# Patient Record
Sex: Female | Born: 1985 | Race: Black or African American | Hispanic: No | Marital: Married | Smoking: Never smoker
Health system: Southern US, Community
[De-identification: ages and names within clinical notes are randomized; demographics above are authoritative.]

## PROBLEM LIST (undated history)

## (undated) ENCOUNTER — Inpatient Hospital Stay (HOSPITAL_COMMUNITY): Payer: Self-pay

## (undated) DIAGNOSIS — N390 Urinary tract infection, site not specified: Secondary | ICD-10-CM

## (undated) DIAGNOSIS — M542 Cervicalgia: Secondary | ICD-10-CM

## (undated) DIAGNOSIS — D649 Anemia, unspecified: Secondary | ICD-10-CM

---

## 2006-04-01 ENCOUNTER — Emergency Department (HOSPITAL_COMMUNITY): Admission: EM | Admit: 2006-04-01 | Discharge: 2006-04-01 | Payer: Self-pay | Admitting: Family Medicine

## 2006-11-07 ENCOUNTER — Emergency Department (HOSPITAL_COMMUNITY): Admission: EM | Admit: 2006-11-07 | Discharge: 2006-11-07 | Payer: Self-pay | Admitting: Emergency Medicine

## 2007-03-23 ENCOUNTER — Emergency Department (HOSPITAL_COMMUNITY): Admission: EM | Admit: 2007-03-23 | Discharge: 2007-03-23 | Payer: Self-pay | Admitting: Family Medicine

## 2008-02-01 ENCOUNTER — Ambulatory Visit (HOSPITAL_COMMUNITY): Admission: RE | Admit: 2008-02-01 | Discharge: 2008-02-01 | Payer: Self-pay | Admitting: Obstetrics

## 2008-02-26 ENCOUNTER — Inpatient Hospital Stay (HOSPITAL_COMMUNITY): Admission: AD | Admit: 2008-02-26 | Discharge: 2008-02-26 | Payer: Self-pay | Admitting: Obstetrics

## 2008-03-18 ENCOUNTER — Inpatient Hospital Stay (HOSPITAL_COMMUNITY): Admission: AD | Admit: 2008-03-18 | Discharge: 2008-03-18 | Payer: Self-pay | Admitting: Obstetrics

## 2008-06-13 ENCOUNTER — Inpatient Hospital Stay (HOSPITAL_COMMUNITY): Admission: AD | Admit: 2008-06-13 | Discharge: 2008-06-13 | Payer: Self-pay | Admitting: Obstetrics & Gynecology

## 2008-06-21 ENCOUNTER — Inpatient Hospital Stay (HOSPITAL_COMMUNITY): Admission: AD | Admit: 2008-06-21 | Discharge: 2008-06-21 | Payer: Self-pay | Admitting: Obstetrics

## 2008-06-22 ENCOUNTER — Inpatient Hospital Stay (HOSPITAL_COMMUNITY): Admission: AD | Admit: 2008-06-22 | Discharge: 2008-06-26 | Payer: Self-pay | Admitting: Psychiatry

## 2008-06-23 ENCOUNTER — Encounter: Payer: Self-pay | Admitting: Obstetrics

## 2010-01-28 ENCOUNTER — Emergency Department (HOSPITAL_COMMUNITY): Admission: EM | Admit: 2010-01-28 | Discharge: 2010-01-28 | Payer: Self-pay | Admitting: Emergency Medicine

## 2010-07-24 ENCOUNTER — Inpatient Hospital Stay: Admission: AD | Admit: 2010-07-24 | Discharge: 2010-07-24 | Payer: Self-pay | Admitting: Obstetrics

## 2010-07-24 ENCOUNTER — Emergency Department (HOSPITAL_COMMUNITY): Admission: EM | Admit: 2010-07-24 | Discharge: 2010-07-24 | Payer: Self-pay | Admitting: Emergency Medicine

## 2010-12-02 ENCOUNTER — Encounter: Payer: Self-pay | Admitting: Obstetrics and Gynecology

## 2011-01-24 LAB — URINALYSIS, ROUTINE W REFLEX MICROSCOPIC
Bilirubin Urine: NEGATIVE
Glucose, UA: NEGATIVE mg/dL
Ketones, ur: NEGATIVE mg/dL
Protein, ur: NEGATIVE mg/dL
pH: 7 (ref 5.0–8.0)

## 2011-01-24 LAB — COMPREHENSIVE METABOLIC PANEL
AST: 13 U/L (ref 0–37)
Albumin: 3.4 g/dL — ABNORMAL LOW (ref 3.5–5.2)
Alkaline Phosphatase: 37 U/L — ABNORMAL LOW (ref 39–117)
BUN: 7 mg/dL (ref 6–23)
Creatinine, Ser: 0.77 mg/dL (ref 0.4–1.2)
GFR calc Af Amer: >60 mL/min (ref >60)
Potassium: 3.4 mEq/L — ABNORMAL LOW (ref 3.5–5.1)
Total Protein: 6.7 g/dL (ref 6.0–8.3)

## 2011-01-24 LAB — CBC
MCV: 89.7 fL (ref 78.0–100.0)
Platelets: 232 10*3/uL (ref 150–400)
RBC: 3.6 MIL/uL — ABNORMAL LOW (ref 3.87–5.11)
RDW: 12.6 % (ref 11.5–15.5)
WBC: 4.1 10*3/uL (ref 4.0–10.5)

## 2011-01-24 LAB — URINE MICROSCOPIC-ADD ON

## 2011-01-24 LAB — DIFFERENTIAL
Eosinophils Relative: 4 % (ref 0–5)
Lymphocytes Relative: 42 % (ref 12–46)
Monocytes Absolute: 0.3 10*3/uL (ref 0.1–1.0)
Monocytes Relative: 6 % (ref 3–12)
Neutro Abs: 1.9 10*3/uL (ref 1.7–7.7)

## 2011-02-04 LAB — URINE MICROSCOPIC-ADD ON

## 2011-02-04 LAB — DIFFERENTIAL
Basophils Relative: 1 % (ref 0–1)
Eosinophils Absolute: 0.1 10*3/uL (ref 0.0–0.7)
Lymphocytes Relative: 34 % (ref 12–46)
Neutro Abs: 2.4 10*3/uL (ref 1.7–7.7)

## 2011-02-04 LAB — URINALYSIS, ROUTINE W REFLEX MICROSCOPIC
Glucose, UA: NEGATIVE mg/dL
Hgb urine dipstick: NEGATIVE
Specific Gravity, Urine: 1.024 (ref 1.005–1.030)

## 2011-02-04 LAB — COMPREHENSIVE METABOLIC PANEL
Albumin: 4 g/dL (ref 3.5–5.2)
Alkaline Phosphatase: 41 U/L (ref 39–117)
BUN: 9 mg/dL (ref 6–23)
GFR calc Af Amer: >60 mL/min (ref >60)
Potassium: 4.3 mEq/L (ref 3.5–5.1)
Sodium: 140 mEq/L (ref 135–145)
Total Protein: 7.6 g/dL (ref 6.0–8.3)

## 2011-02-04 LAB — URINE CULTURE: Culture: NO GROWTH

## 2011-02-04 LAB — POCT PREGNANCY, URINE: Preg Test, Ur: NEGATIVE

## 2011-02-04 LAB — CBC
Platelets: 245 10*3/uL (ref 150–400)
RDW: 14.1 % (ref 11.5–15.5)

## 2011-03-26 ENCOUNTER — Emergency Department (HOSPITAL_BASED_OUTPATIENT_CLINIC_OR_DEPARTMENT_OTHER)
Admission: EM | Admit: 2011-03-26 | Discharge: 2011-03-26 | Disposition: A | Discharge status: Home / Self Care | Payer: 59 | Attending: Emergency Medicine | Admitting: Emergency Medicine

## 2011-03-26 ENCOUNTER — Emergency Department (INDEPENDENT_AMBULATORY_CARE_PROVIDER_SITE_OTHER): Payer: 59

## 2011-03-26 DIAGNOSIS — IMO0002 Reserved for concepts with insufficient information to code with codable children: Secondary | ICD-10-CM

## 2011-03-26 DIAGNOSIS — R109 Unspecified abdominal pain: Secondary | ICD-10-CM | POA: Insufficient documentation

## 2011-03-26 DIAGNOSIS — K219 Gastro-esophageal reflux disease without esophagitis: Secondary | ICD-10-CM | POA: Insufficient documentation

## 2011-03-26 LAB — URINALYSIS, ROUTINE W REFLEX MICROSCOPIC
Bilirubin Urine: NEGATIVE
Ketones, ur: 15 mg/dL — AB
Nitrite: NEGATIVE
Specific Gravity, Urine: 1.036 — ABNORMAL HIGH (ref 1.005–1.030)
Urobilinogen, UA: 1 mg/dL (ref 0.0–1.0)

## 2011-03-26 LAB — WET PREP, GENITAL
Clue Cells Wet Prep HPF POC: NONE SEEN
Trich, Wet Prep: NONE SEEN
Yeast Wet Prep HPF POC: NONE SEEN

## 2011-03-26 LAB — PREGNANCY, URINE: Preg Test, Ur: POSITIVE

## 2011-03-26 NOTE — Op Note (Signed)
NAMENATHALIE, Donna Conley             ACCOUNT NO.:  [id number]   MEDICAL RECORD NO.:  [id number]          PATIENT TYPE:  INP   LOCATION:  9199                          FACILITY:  [facility]   PHYSICIAN:  Charles A. Clearance Coots, M.D.DATE OF BIRTH:  14-Mar-1986   DATE OF PROCEDURE:  06/23/2008  DATE OF DISCHARGE:                               OPERATIVE REPORT   PREOPERATIVE DIAGNOSIS:  Arrest of descent.   POSTOPERATIVE DIAGNOSIS:  Arrest of descent.   PROCEDURE:  Primary low-transverse cesarean section.   SURGEON:  Charles A. Clearance Coots, MD   ASSISTANTSelmer Dominion, surgical technician.   ANESTHESIA:  General.   ESTIMATED BLOOD LOSS:  1200 mL.   IV FLUIDS:  3000 mL.   URINE OUTPUT:  125 mL clear.   COMPLICATIONS:  None.   Foley to gravity.   FINDINGS:  Viable female at 15.  Apgars of 8 at 1 minute, 9 at 5  minutes.  Weight of 7 pounds.  Cord pH of 7.37.   OPERATION:  The patient was brought to the operating room after  satisfactory general endotracheal anesthesia.  Because of inadequate  epidural level, the abdomen was prepped and draped in the usual sterile  fashion.  Pfannenstiel skin incision was made with the scalpel that was  deepened down to the fascia with a scalpel.  The fascia was nicked in  the midline and the fascial incision was extended to left and to the  right with curved Mayo scissors.  The superior and inferior fascial  edges were taken off of the rectus muscles both blunt and sharp  dissection.  The rectus muscle was bluntly and sharply divided in the  midline and the peritoneum was entered digitally and was digitally  extended to left and to the right.  The bladder blade was positioned and  the vesicouterine fold of peritoneum above the reflection of the urinary  bladder was grasped with forceps and incised and undermined with  Metzenbaum scissors.  The incision was extended to left and to the right  with Metzenbaum scissors.  The bladder flap was then developed and  the  bladder blade was repositioned in front of the urinary bladder placing  it well out of the operative field.  The uterus was then entered  transversely in the lower uterine segment with the scalpel.  Clear  amniotic fluid was expelled.  The uterine incision was extended to left  and to the right with the bandage scissors.  The vertex was noted to be  left occiput posterior.  The occiput was rotated anteriorly into the  incision and the delivery was accomplished with the aid of fundal  pressure from the assistant.  The infant's mouth and nose were suctioned  with a suction bulb and the delivery was completed with the aid of  fundal pressure from the assistant.  Cord blood and cord pH was obtained  and the placenta was spontaneously expelled from the uterine cavity  intact.  The endometrial surface was thoroughly debrided with a dry lap  sponge.  The edges of the uterine incision were grasped with ring  forceps.  The uterus was closed with a continuous interlocking suture of  0 Monocryl from each corner to the center.  A posterior extension was  also closed with continuous interlocking suture of 0 Monocryl in 2  layers.  Hemostasis was excellent.  Pelvic cavity was thoroughly  irrigated with warm saline solution and all clots were removed.  The  abdomen was then closed as follows.  The parietal peritoneum was closed  with continuous suture of 2-0 Monocryl.  Fascia was closed with a  continuous suture of 0 Vicryl.  Subcutaneous tissue was thoroughly  irrigated with warm saline solution and all areas of subcutaneous  bleeding were coagulated with the Bovie.  Skin was then closed with  surgical stainless steel staples.  Sterile bandage was applied to the  incision closure.  Surgical technician indicated that all needle,  sponge, and instrument counts were correct x2.  The patient tolerated  the procedure well, transported to the recovery room in satisfactory  condition.      Charles  A. Clearance Coots, M.D.  Electronically Signed     CAH/MEDQ  D:  06/23/2008  T:  06/23/2008  Job:  [id number]

## 2011-03-29 NOTE — Discharge Summary (Signed)
NAMESETSUKO, ROBINS             ACCOUNT NO.:  [id number]   MEDICAL RECORD NO.:  [id number]          PATIENT TYPE:  INP   LOCATION:  9111                          FACILITY:  [facility]   PHYSICIAN:  Roseanna Rainbow, M.D.DATE OF BIRTH:  11-Aug-1986   DATE OF ADMISSION:  06/22/2008  DATE OF DISCHARGE:  06/26/2008                               DISCHARGE SUMMARY   CHIEF COMPLAINT:  The patient is a 25 year old gravida with an estimated  date of confinement of June 29, 2008, who presents with uterine  contractions.   ANTEPARTUM COURSE:  Prenatal labs: GBS positive.   PAST SURGICAL HISTORY:  She denies.   PAST MEDICAL HISTORY:  She denies.   MEDICATIONS:  Prenatal vitamins.   ALLERGIES:  No known drug allergies.   SOCIAL HISTORY:  She is single.  She denies any tobacco, ethanol, or  drug use.   FAMILY HISTORY:  Hypertension.   PHYSICAL EXAMINATION:  VITAL SIGNS:  Stable.  GENERAL:  Afebrile.  LUNGS:  Clear to auscultation bilaterally.  HEART:  Regular rate and rhythm.  ABDOMEN:  Gravid, nontender, cervix 3 cm dilated, 70% effaced with a  vertex at a -3 station.   ASSESSMENT AND PLAN:  Intrauterine pregnancy at 39 weeks, latent phase  of labor.  Admit, expectant management.   HOSPITAL COURSE:  The patient was admitted.  She progressed in labor to  9 cm of dilatation.  The position of the fetal head was felt to be left  occiput posterior and this persisted.  There was no further dilatation  or descend for greater than 3 hours and at this point the decision was  made to proceed with a cesarean delivery.  Please see the dictated  operative summary for further details.  On postoperative day #1, her  hemoglobin was 7.9.  She was hemodynamically stable.  The remainder of  her hospital course was uneventful.  She was discharged to home on  postoperative day #3 tolerating a regular diet.   DISCHARGE DIAGNOSES:  1. Intrauterine pregnancy at term.  2. Arrest of dilatation.  3.  Persistent occiput posterior.   PROCEDURE:  Cesarean delivery.   CONDITION:  Stable.   DIET:  Regular.   ACTIVITY:  Pelvic rest and progressive activity.   MEDICATIONS:  Percocet, ibuprofen, and NuvaRing.   DISPOSITION:  The patient was to follow up in the office in 2 weeks.      Roseanna Rainbow, M.D.  Electronically Signed     LAJ/MEDQ  D:  07/17/2008  T:  07/18/2008  Job:  [id number]

## 2011-05-12 ENCOUNTER — Emergency Department (HOSPITAL_BASED_OUTPATIENT_CLINIC_OR_DEPARTMENT_OTHER)
Admission: EM | Admit: 2011-05-12 | Discharge: 2011-05-12 | Disposition: A | Discharge status: Home / Self Care | Payer: Self-pay | Attending: Emergency Medicine | Admitting: Emergency Medicine

## 2011-05-12 DIAGNOSIS — L293 Anogenital pruritus, unspecified: Secondary | ICD-10-CM | POA: Insufficient documentation

## 2011-05-12 DIAGNOSIS — N760 Acute vaginitis: Secondary | ICD-10-CM | POA: Insufficient documentation

## 2011-05-12 DIAGNOSIS — K219 Gastro-esophageal reflux disease without esophagitis: Secondary | ICD-10-CM | POA: Insufficient documentation

## 2011-05-12 LAB — URINE MICROSCOPIC-ADD ON

## 2011-05-12 LAB — URINALYSIS, ROUTINE W REFLEX MICROSCOPIC
Bilirubin Urine: NEGATIVE
Glucose, UA: NEGATIVE mg/dL
Ketones, ur: NEGATIVE mg/dL
pH: 7 (ref 5.0–8.0)

## 2011-05-12 LAB — WET PREP, GENITAL

## 2011-05-13 LAB — GC/CHLAMYDIA PROBE AMP, GENITAL
Chlamydia, DNA Probe: NEGATIVE
GC Probe Amp, Genital: NEGATIVE

## 2011-05-16 ENCOUNTER — Emergency Department (HOSPITAL_BASED_OUTPATIENT_CLINIC_OR_DEPARTMENT_OTHER)
Admission: EM | Admit: 2011-05-16 | Discharge: 2011-05-17 | Disposition: A | Discharge status: Home / Self Care | Payer: Self-pay | Attending: Emergency Medicine | Admitting: Emergency Medicine

## 2011-05-16 DIAGNOSIS — L989 Disorder of the skin and subcutaneous tissue, unspecified: Secondary | ICD-10-CM | POA: Insufficient documentation

## 2011-08-09 LAB — CBC
HCT: 23.8 — ABNORMAL LOW
HCT: 37.2
Hemoglobin: 7.9 — CL
MCHC: 32.7
MCHC: 33.3
MCV: 90.8
Platelets: 172
Platelets: 264
RDW: 15.8 — ABNORMAL HIGH
WBC: 9.5

## 2011-08-09 LAB — URINALYSIS, ROUTINE W REFLEX MICROSCOPIC
Ketones, ur: NEGATIVE
Protein, ur: NEGATIVE
Urobilinogen, UA: 0.2

## 2011-08-09 LAB — URINE MICROSCOPIC-ADD ON

## 2011-08-09 LAB — RPR: RPR Ser Ql: NONREACTIVE

## 2011-08-22 ENCOUNTER — Inpatient Hospital Stay (INDEPENDENT_AMBULATORY_CARE_PROVIDER_SITE_OTHER)
Admission: RE | Admit: 2011-08-22 | Discharge: 2011-08-22 | Disposition: A | Discharge status: Home / Self Care | Payer: Self-pay | Source: Ambulatory Visit | Attending: Emergency Medicine | Admitting: Emergency Medicine

## 2011-08-22 DIAGNOSIS — L988 Other specified disorders of the skin and subcutaneous tissue: Secondary | ICD-10-CM

## 2011-08-23 ENCOUNTER — Emergency Department (HOSPITAL_BASED_OUTPATIENT_CLINIC_OR_DEPARTMENT_OTHER)
Admission: EM | Admit: 2011-08-23 | Discharge: 2011-08-23 | Disposition: A | Discharge status: Home / Self Care | Payer: 59 | Attending: Emergency Medicine | Admitting: Emergency Medicine

## 2011-08-23 DIAGNOSIS — R21 Rash and other nonspecific skin eruption: Secondary | ICD-10-CM

## 2011-08-23 MED ORDER — PREDNISONE 20 MG PO TABS: 40.0000 mg | ORAL_TABLET | Freq: Every day | ORAL | Status: AC

## 2011-08-23 MED ORDER — PREDNISONE 50 MG PO TABS
60.0000 mg | ORAL_TABLET | Freq: Once | ORAL | Status: AC
  Administered 2011-08-23: 60 mg via ORAL
  Filled 2011-08-23: qty 1

## 2011-08-23 NOTE — ED Notes (Signed)
Raised rash noted on bilateral hands and feet that started 2 days ago unrelieved after using steroid cream.

## 2011-08-23 NOTE — ED Provider Notes (Addendum)
History   Donna Conley with rash on b/l hands and feet. Gradual onset about 2-3d ago. First noticed on palms of hands and has since spread to dorsal aspect of hands and also feet. Rash does not itch, burn or hurt. No drainage.  No new exposures that pt is aware of. No sore throat. No fever or chills. No swelling. No joint pain. No hx of similar rash. No n/v. No contacts with similar symptoms. Has tried steroid cream without change.  CSN: [id number] Arrival date & time: 08/23/2011  9:55 AM  Chief Complaint  Patient presents with  . Rash    (Consider location/radiation/quality/duration/timing/severity/associated sxs/prior treatment) HPI  History reviewed. No pertinent past medical history.  Past Surgical History  Procedure Date  . Cesarean section     No family history on file.  History  Substance Use Topics  . Smoking status: Never Smoker   . Smokeless tobacco: Never Used  . Alcohol Use: No    OB History    Grav Para Term Preterm Abortions TAB SAB Ect Mult Living                  Review of Systems   Review of symptoms negative unless otherwise noted in HPI.   Allergies  Review of patient's allergies indicates no known allergies.  Home Medications  No current outpatient prescriptions on file.  BP 117/71  Temp(Src) 98.3 F (36.8 C) (Oral)  Resp 16  Ht 5\' 3"  (1.6 m)  Wt 179 lb (81.194 kg)  BMI 31.71 kg/m2  SpO2 100%  LMP 08/07/2011  Physical Exam  Nursing note and vitals reviewed. Constitutional: She appears well-developed and well-nourished. No distress.  HENT:  Head: Normocephalic and atraumatic.  Mouth/Throat: Oropharynx is clear and moist.  Eyes: Conjunctivae are normal. Right eye exhibits no discharge. Left eye exhibits no discharge.  Neck: Neck supple.  Cardiovascular: Normal rate, regular rhythm and normal heart sounds.  Exam reveals no gallop and no friction rub.   No murmur heard. Pulmonary/Chest: Effort normal and breath sounds normal. No respiratory  distress.  Abdominal: Soft. She exhibits no distension. There is no tenderness.  Musculoskeletal: She exhibits no edema and no tenderness.  Neurological: She is alert.  Skin: Skin is warm and dry.       Flat, erythematous blanching rash b/l hands and feet. Irregular macules. Nontender. No induration. No drainage.   Psychiatric: She has a normal mood and affect. Her behavior is normal. Thought content normal.    ED Course  Procedures (including critical care time)  Labs Reviewed - No data to display No results found.   No diagnosis found.    MDM  Donna Conley with rash to b/l hands and feet. Nonspecific. No new exposures. Afebrile and nontoxic. Plan short course steroids for possible inflammatory response. Outpt fu.       Raeford Razor, MD 08/27/11 1009  Raeford Razor, MD 08/29/11 [phone]

## 2011-09-11 ENCOUNTER — Other Ambulatory Visit: Payer: Self-pay

## 2011-09-11 ENCOUNTER — Emergency Department (HOSPITAL_BASED_OUTPATIENT_CLINIC_OR_DEPARTMENT_OTHER)
Admission: EM | Admit: 2011-09-11 | Discharge: 2011-09-11 | Disposition: A | Discharge status: Home / Self Care | Payer: Self-pay | Attending: Emergency Medicine | Admitting: Emergency Medicine

## 2011-09-11 ENCOUNTER — Emergency Department (INDEPENDENT_AMBULATORY_CARE_PROVIDER_SITE_OTHER): Payer: Self-pay

## 2011-09-11 ENCOUNTER — Encounter (HOSPITAL_BASED_OUTPATIENT_CLINIC_OR_DEPARTMENT_OTHER): Payer: Self-pay | Admitting: Family Medicine

## 2011-09-11 DIAGNOSIS — R079 Chest pain, unspecified: Secondary | ICD-10-CM | POA: Insufficient documentation

## 2011-09-11 DIAGNOSIS — R0789 Other chest pain: Secondary | ICD-10-CM

## 2011-09-11 DIAGNOSIS — M549 Dorsalgia, unspecified: Secondary | ICD-10-CM | POA: Insufficient documentation

## 2011-09-11 MED ORDER — HYDROCODONE-ACETAMINOPHEN 5-325 MG PO TABS: 1.0000 | ORAL_TABLET | ORAL | Status: AC | PRN

## 2011-09-11 NOTE — ED Notes (Addendum)
Pt c/o "pain started in my upper back and through to my chest" while driving at 1610. Pt sts "it feels like something is stuck in my throat but I didn't eat this morning". Pt sts she felt "fine" upon awakening today. Pt denies cardiac history. Pt denies shob, n/v, diaphoresis.  Pt sts pain "comes and goes" and last a couple of seconds.

## 2011-09-11 NOTE — ED Provider Notes (Signed)
History     CSN: [id number] Arrival date & time: 09/11/2011  8:06 AM   First MD Initiated Contact with Patient 09/11/11 0818      Chief Complaint  Patient presents with  . Chest Pain    (Consider location/radiation/quality/duration/timing/severity/associated sxs/prior treatment) HPI Comments: Patient is a 25 year old woman who had had some vomiting and diarrhea yesterday. Today she was driving to work around 6:96 AM, and noted a pain in her back, became through the chest.  It felt like something was stuck in her throat, but she had not eating anything today. The pain comes and goes every couple of minutes. She therefore sought evaluation.  Patient is a 25 y.o. female presenting with chest pain. The history is provided by the patient. No language interpreter was used.  Chest Pain The chest pain began 1 - 2 hours ago. Episode Length: The pain will come on in her back and go through the chest, lasting a minute or so and then going away. Chest pain occurs intermittently. At its most intense, the pain is at 7/10. The pain is currently at 7/10. The quality of the pain is described as brief (The pain as like something stuck in the throat.). The pain does not radiate. Exacerbated by: The pain is neither relieved, nor seems to be caused by any particular stimulus. Pertinent negatives for primary symptoms include no fever. She tried nothing for the symptoms. Risk factors include no known risk factors.     History reviewed. No pertinent past medical history.  Past Surgical History  Procedure Date  . Cesarean section     History reviewed. No pertinent family history.  History  Substance Use Topics  . Smoking status: Never Smoker   . Smokeless tobacco: Never Used  . Alcohol Use: No    OB History    Grav Para Term Preterm Abortions TAB SAB Ect Mult Living                  Review of Systems  Constitutional: Negative.  Negative for fever and chills.  HENT: Negative.   Eyes: Negative.    Respiratory: Negative.   Cardiovascular: Positive for chest pain.  Gastrointestinal:       She had some vomiting and diarrhea yesterday.  Genitourinary: Negative.   Musculoskeletal: Positive for back pain.  Skin: Negative.   Neurological: Negative.   Psychiatric/Behavioral: Negative.     Allergies  Review of patient's allergies indicates no known allergies.  Home Medications   Current Outpatient Rx  Name Route Sig Dispense Refill  . ACETAMINOPHEN 325 MG PO TABS Oral Take 650 mg by mouth every 6 (six) hours as needed. For headache and weakness     . HYDROCODONE-ACETAMINOPHEN 5-325 MG PO TABS Oral Take 1 tablet by mouth every 4 (four) hours as needed for pain. 12 tablet 0    BP 119/74  Pulse 88  Temp(Src) 98.5 F (36.9 C) (Oral)  Resp 16  Ht 5\' 3"  (1.6 m)  Wt 176 lb (79.833 kg)  BMI 31.18 kg/m2  SpO2 100%  LMP 08/31/2011  Physical Exam  Nursing note and vitals reviewed. Constitutional: She is oriented to person, place, and time. She appears well-developed and well-nourished. No distress.  HENT:  Head: Normocephalic and atraumatic.  Right Ear: External ear normal.  Left Ear: External ear normal.  Mouth/Throat: Oropharynx is clear and moist.  Eyes: Conjunctivae and EOM are normal. Pupils are equal, round, and reactive to light.  Neck: Normal range of motion. Neck supple.  No thyromegaly present.  Cardiovascular: Normal rate, regular rhythm and normal heart sounds.   Pulmonary/Chest: Effort normal and breath sounds normal.  Abdominal: Soft. Bowel sounds are normal. She exhibits no distension. There is no tenderness.  Musculoskeletal: Normal range of motion.       She localizes pain to the interscapular region. There is no palpable deformity or tenderness.  Lymphadenopathy:    She has no cervical adenopathy.  Neurological: She is alert and oriented to person, place, and time. She has normal reflexes.       No sensory or motor deficits.  Skin: Skin is warm and dry.    Psychiatric: She has a normal mood and affect. Her behavior is normal.    ED Course  Procedures (including critical care time)    Date: 09/11/2011  Rate:91  Rhythm: normal sinus rhythm  QRS Axis: normal  Intervals: normal  ST/T Wave abnormalities: normal  Conduction Disutrbances:none  Narrative Interpretation: Normal EKG  Old EKG Reviewed: none available  9:29 AM Patient was seen and had physical examination. EKG was normal. Chest x-ray was ordered.    Dg Chest 2 View  09/11/2011  *RADIOLOGY REPORT*  Clinical Data: Interscapular pain  CHEST - 2 VIEW  Comparison: Portable chest x-ray of 07/24/2010  Findings: The lungs are clear.  Mediastinal contours appear normal. The heart is within normal limits in size.  No bony abnormality is seen.  IMPRESSION: No active lung disease.  Original Report Authenticated By: Juline Patch, M.D.     1. Chest pain          Carleene Cooper III, MD 09/11/11 0930

## 2011-11-18 ENCOUNTER — Emergency Department (HOSPITAL_COMMUNITY)
Admission: EM | Admit: 2011-11-18 | Discharge: 2011-11-18 | Disposition: A | Discharge status: Other Acute Inpatient Hospital | Payer: Self-pay | Source: Home / Self Care | Attending: Family Medicine | Admitting: Family Medicine

## 2011-11-18 ENCOUNTER — Encounter (HOSPITAL_COMMUNITY): Payer: Self-pay | Admitting: *Deleted

## 2011-11-18 DIAGNOSIS — N898 Other specified noninflammatory disorders of vagina: Secondary | ICD-10-CM

## 2011-11-18 LAB — WET PREP, GENITAL: Trich, Wet Prep: NONE SEEN

## 2011-11-18 MED ORDER — METRONIDAZOLE 0.75 % VA GEL: 1.0000 | VAGINAL | Status: AC

## 2011-11-18 NOTE — ED Provider Notes (Signed)
History     CSN: [id number]  Arrival date & time 11/18/11  1517   First MD Initiated Contact with Patient 11/18/11 1638      Chief Complaint  Patient presents with  . Vaginal Discharge    (Consider location/radiation/quality/duration/timing/severity/associated sxs/prior treatment) Patient is a 26 y.o. female presenting with vaginal discharge. The history is provided by the patient.  Vaginal Discharge This is a new problem. The current episode started more than 2 days ago. The problem has not changed since onset.Pertinent negatives include no abdominal pain. Associated symptoms comments: Vaginal odor sx, no bleeding or pain or urinary sx.Marland Kitchen    History reviewed. No pertinent past medical history.  Past Surgical History  Procedure Date  . Cesarean section     History reviewed. No pertinent family history.  History  Substance Use Topics  . Smoking status: Never Smoker   . Smokeless tobacco: Never Used  . Alcohol Use: No    OB History    Grav Para Term Preterm Abortions TAB SAB Ect Mult Living                  Review of Systems  Constitutional: Negative.   Gastrointestinal: Negative.  Negative for abdominal pain.  Genitourinary: Positive for vaginal discharge. Negative for dysuria, urgency, frequency, vaginal bleeding and pelvic pain.    Allergies  Review of patient's allergies indicates no known allergies.  Home Medications   Current Outpatient Rx  Name Route Sig Dispense Refill  . ACETAMINOPHEN 325 MG PO TABS Oral Take 650 mg by mouth every 6 (six) hours as needed. For headache and weakness     . METRONIDAZOLE 0.75 % VA GEL Vaginal Place 1 Applicatorful vaginally 1 day or 1 dose. At hs for 5 nights. 70 g 0    BP 112/71  Pulse 77  Temp(Src) 98.4 F (36.9 C) (Oral)  Resp 16  SpO2 100%  LMP 11/07/2011  Physical Exam  Nursing note and vitals reviewed. Constitutional: She appears well-developed and well-nourished.  HENT:  Head: Normocephalic.  Abdominal:  Soft. Bowel sounds are normal. She exhibits no distension. There is no tenderness. There is no rebound and no guarding.  Genitourinary: Vagina normal and uterus normal. Uterus is not tender. Cervix exhibits no motion tenderness and no discharge. No erythema or tenderness around the vagina. No vaginal discharge found.  Skin: Skin is warm and dry.  Psychiatric: She has a normal mood and affect.    ED Course  Procedures (including critical care time)  Labs Reviewed - No data to display No results found.   1. Vaginal discharge       MDM          Barkley Bruns, MD 11/18/11 1659

## 2011-11-18 NOTE — ED Notes (Signed)
Pt  Has  Symptoms  Of  vagianal  Discharge      X  3  Days   denys  Any  Pain      -  She  Is  Ambulating  Upright  And  Is  In no  Distress

## 2011-11-19 ENCOUNTER — Telehealth (HOSPITAL_COMMUNITY): Payer: Self-pay | Admitting: *Deleted

## 2011-11-19 LAB — GC/CHLAMYDIA PROBE AMP, GENITAL: Chlamydia, DNA Probe: NEGATIVE

## 2011-12-13 ENCOUNTER — Encounter (HOSPITAL_BASED_OUTPATIENT_CLINIC_OR_DEPARTMENT_OTHER): Payer: Self-pay | Admitting: Family Medicine

## 2011-12-13 ENCOUNTER — Emergency Department (HOSPITAL_BASED_OUTPATIENT_CLINIC_OR_DEPARTMENT_OTHER)
Admission: EM | Admit: 2011-12-13 | Discharge: 2011-12-13 | Disposition: A | Discharge status: Home / Self Care | Payer: Self-pay | Attending: Emergency Medicine | Admitting: Emergency Medicine

## 2011-12-13 DIAGNOSIS — M542 Cervicalgia: Secondary | ICD-10-CM | POA: Insufficient documentation

## 2011-12-13 DIAGNOSIS — R51 Headache: Secondary | ICD-10-CM | POA: Insufficient documentation

## 2011-12-13 DIAGNOSIS — J329 Chronic sinusitis, unspecified: Secondary | ICD-10-CM | POA: Insufficient documentation

## 2011-12-13 MED ORDER — MOMETASONE FUROATE 50 MCG/ACT NA SUSP: 2.0000 | Freq: Every day | NASAL | Status: DC

## 2011-12-13 MED ORDER — AZITHROMYCIN 250 MG PO TABS: 250.0000 mg | ORAL_TABLET | Freq: Every day | ORAL | Status: AC

## 2011-12-13 MED ORDER — TRAMADOL HCL 50 MG PO TABS: 50.0000 mg | ORAL_TABLET | Freq: Four times a day (QID) | ORAL | Status: AC | PRN

## 2011-12-13 MED ORDER — OXYMETAZOLINE HCL 0.05 % NA SOLN: 2.0000 | Freq: Two times a day (BID) | NASAL | Status: AC

## 2011-12-13 NOTE — ED Notes (Signed)
Pt c/o right side face aching with pain to back of head and right neck. Pt also c/o felling "lightheaded" this morning. Pt sts right ear hurts.

## 2011-12-13 NOTE — ED Provider Notes (Signed)
History     CSN: [id number]  Arrival date & time 12/13/11  7829   First MD Initiated Contact with Patient 12/13/11 0740      Chief Complaint  Patient presents with  . Headache    (Consider location/radiation/quality/duration/timing/severity/associated sxs/prior treatment) The history is provided by the patient.   Pt p/w 24 hr of R facial pain, R ear pain and tender R cervical lymph nodes. She has some mild lightheadedness associated with it and low grade fever. No focal neuro deficits. No visual disturbances.  History reviewed. No pertinent past medical history.  Past Surgical History  Procedure Date  . Cesarean section     No family history on file.  History  Substance Use Topics  . Smoking status: Never Smoker   . Smokeless tobacco: Never Used  . Alcohol Use: No    OB History    Grav Para Term Preterm Abortions TAB SAB Ect Mult Living                  Review of Systems  Constitutional: Positive for fever. Negative for chills.  HENT: Positive for ear pain, neck pain, dental problem and sinus pressure. Negative for hearing loss, congestion, sore throat, rhinorrhea, neck stiffness and ear discharge.   Eyes: Negative for photophobia and visual disturbance.  Respiratory: Negative for cough, shortness of breath and wheezing.   Cardiovascular: Negative for chest pain.  Gastrointestinal: Negative for nausea, vomiting and abdominal pain.  Neurological: Positive for light-headedness and headaches. Negative for weakness and numbness.    Allergies  Review of patient's allergies indicates no known allergies.  Home Medications   Current Outpatient Rx  Name Route Sig Dispense Refill  . ACETAMINOPHEN 325 MG PO TABS Oral Take 650 mg by mouth every 6 (six) hours as needed. For headache and weakness     . AZITHROMYCIN 250 MG PO TABS Oral Take 1 tablet (250 mg total) by mouth daily. Take first 2 tablets together, then 1 every day until finished. 6 tablet 0  . MOMETASONE  FUROATE 50 MCG/ACT NA SUSP Nasal Place 2 sprays into the nose daily. 17 g 12  . OXYMETAZOLINE HCL 0.05 % NA SOLN Nasal Place 2 sprays into the nose 2 (two) times daily. 30 mL 0  . TRAMADOL HCL 50 MG PO TABS Oral Take 1 tablet (50 mg total) by mouth every 6 (six) hours as needed for pain. 15 tablet 0    BP 124/87  Pulse 85  Temp(Src) 99.1 F (37.3 C) (Oral)  Resp 20  Ht 5\' 3"  (1.6 m)  Wt 173 lb (78.472 kg)  BMI 30.65 kg/m2  SpO2 100%  LMP 12/01/2011  Physical Exam  Nursing note and vitals reviewed. Constitutional: She is oriented to person, place, and time. She appears well-developed and well-nourished. No distress.  HENT:  Head: Normocephalic and atraumatic.  Mouth/Throat: Oropharynx is clear and moist.       Bl TM's bulging with no erythema. O/P clear w/o exudate. R bottom molar with caries present and impaction from 3rd molar. No abscess present. Significant tenderness to percussion over R frontal and maxillary sinuses. Cervical lymphadenopathy present on R. No nuchal rigidity or evidence of meningismus   Eyes: EOM are normal. Pupils are equal, round, and reactive to light.  Neck: Normal range of motion. Neck supple.  Cardiovascular: Normal rate and regular rhythm.   Pulmonary/Chest: Effort normal and breath sounds normal. No respiratory distress. She has no wheezes. She has no rales.  Abdominal: Soft. Bowel  sounds are normal. There is no tenderness. There is no rebound and no guarding.  Musculoskeletal: Normal range of motion. She exhibits no edema and no tenderness.  Lymphadenopathy:    She has cervical adenopathy.  Neurological: She is alert and oriented to person, place, and time.       Ambulatory, 5/5 motor  Skin: Skin is warm and dry. No rash noted. No erythema.  Psychiatric: She has a normal mood and affect. Her behavior is normal.    ED Course  Procedures (including critical care time)  Labs Reviewed - No data to display No results found.   1. Sinusitis   2.  Headache       MDM         Loren Racer, MD 12/13/11 [phone]

## 2012-01-21 ENCOUNTER — Emergency Department (HOSPITAL_BASED_OUTPATIENT_CLINIC_OR_DEPARTMENT_OTHER)
Admission: EM | Admit: 2012-01-21 | Discharge: 2012-01-21 | Disposition: A | Discharge status: Home Health | Payer: BC Managed Care – PPO | Attending: Emergency Medicine | Admitting: Emergency Medicine

## 2012-01-21 ENCOUNTER — Encounter (HOSPITAL_COMMUNITY): Payer: Self-pay | Admitting: *Deleted

## 2012-01-21 ENCOUNTER — Inpatient Hospital Stay (HOSPITAL_COMMUNITY)
Admission: AD | Admit: 2012-01-21 | Discharge: 2012-01-21 | Disposition: A | Discharge status: Home / Self Care | Payer: BC Managed Care – PPO | Source: Ambulatory Visit | Attending: Family Medicine | Admitting: Family Medicine

## 2012-01-21 ENCOUNTER — Encounter (HOSPITAL_BASED_OUTPATIENT_CLINIC_OR_DEPARTMENT_OTHER): Payer: Self-pay | Admitting: *Deleted

## 2012-01-21 DIAGNOSIS — Z3201 Encounter for pregnancy test, result positive: Secondary | ICD-10-CM

## 2012-01-21 DIAGNOSIS — R109 Unspecified abdominal pain: Secondary | ICD-10-CM | POA: Insufficient documentation

## 2012-01-21 DIAGNOSIS — O99891 Other specified diseases and conditions complicating pregnancy: Secondary | ICD-10-CM | POA: Insufficient documentation

## 2012-01-21 DIAGNOSIS — R110 Nausea: Secondary | ICD-10-CM

## 2012-01-21 HISTORY — DX: Urinary tract infection, site not specified: N39.0

## 2012-01-21 LAB — URINALYSIS, ROUTINE W REFLEX MICROSCOPIC
Nitrite: NEGATIVE
Specific Gravity, Urine: 1.024 (ref 1.005–1.030)
Urobilinogen, UA: 1 mg/dL (ref 0.0–1.0)
pH: 7 (ref 5.0–8.0)

## 2012-01-21 LAB — PREGNANCY, URINE: Preg Test, Ur: POSITIVE — AB

## 2012-01-21 MED ORDER — PRENATAL VITAMINS (DIS) PO TABS: ORAL_TABLET | ORAL | Status: DC

## 2012-01-21 NOTE — MAU Note (Signed)
Patient states she is having abdominal pain. Was seen at [facility] and was told she was pregnant. Patient states she was told to come here if her symptoms got worst.

## 2012-01-21 NOTE — ED Notes (Signed)
States for the past week she has been nauseated, tired, lower abdominal discomfort and faint feeling.

## 2012-01-21 NOTE — ED Notes (Signed)
Intermittent abd pain/ cramping/nausea since last week, able to drink and eat, Hx UTI

## 2012-01-21 NOTE — MAU Provider Note (Signed)
History     CSN: [id number]  Arrival date and time: 01/21/12 7846   First Provider Initiated Contact with Patient 01/21/12 2033      Chief Complaint  Patient presents with  . Abdominal Pain   HPI 26 y.o. G1P1 at Unknown EGA. Unsure LMP, possibly 12/27/11. States she has not missed her period yet. Low abd pain, intermittent and mild x 1 week, "smear" of blood this morning, none now. She initially denied bleeding in triage. She was seen at [facility] ED 2 hours ago with nausea and vomiting, + UPT then. UA negative. Denied abdominal pain or bleeding at that time. States that she wants an ultrasound because she doesn't known exactly when her last period was, although she knows is was sometime in February. She is planning prenatal care at [facility].    Past Medical History  Diagnosis Date  . Urinary tract infection     Past Surgical History  Procedure Date  . Cesarean section     History reviewed. No pertinent family history.  History  Substance Use Topics  . Smoking status: Never Smoker   . Smokeless tobacco: Never Used  . Alcohol Use: No    Allergies: No Known Allergies  Prescriptions prior to admission  Medication Sig Dispense Refill  . acetaminophen-codeine (TYLENOL #3) 300-30 MG per tablet Take 1 tablet by mouth 3 (three) times daily as needed. For pain      . Prenatal Vitamins (DIS) TABS One a day  30 tablet  0    Review of Systems  Constitutional: Negative.   Respiratory: Negative.   Cardiovascular: Negative.   Gastrointestinal: Positive for nausea, vomiting and abdominal pain. Negative for diarrhea and constipation.  Genitourinary: Negative for dysuria, urgency, frequency, hematuria and flank pain.       Negative for vaginal bleeding, vaginal discharge  Musculoskeletal: Negative.   Neurological: Negative.   Psychiatric/Behavioral: Negative.    Physical Exam   Height 5\' 3"  (1.6 m), weight 191 lb (86.637 kg).  Physical Exam  Nursing note and vitals  reviewed. Constitutional: She is oriented to person, place, and time. She appears well-developed and well-nourished. No distress.  Cardiovascular: Normal rate and regular rhythm.   Respiratory: Effort normal. No respiratory distress.  GI: Soft. There is no tenderness.  Musculoskeletal: Normal range of motion.  Neurological: She is alert and oriented to person, place, and time.  Skin: Skin is warm and dry.  Psychiatric: She has a normal mood and affect.    MAU Course  Procedures  Results for orders placed during the hospital encounter of 01/21/12 (from the past 24 hour(s))  PREGNANCY, URINE     Status: Abnormal   Collection Time   01/21/12  6:09 PM      Component Value Range   Preg Test, Ur POSITIVE (*) NEGATIVE   URINALYSIS, ROUTINE W REFLEX MICROSCOPIC     Status: Normal   Collection Time   01/21/12  6:09 PM      Component Value Range   Color, Urine YELLOW  YELLOW    APPearance CLEAR  CLEAR    Specific Gravity, Urine 1.024  1.005 - 1.030    pH 7.0  5.0 - 8.0    Glucose, UA NEGATIVE  NEGATIVE (mg/dL)   Hgb urine dipstick NEGATIVE  NEGATIVE    Bilirubin Urine NEGATIVE  NEGATIVE    Ketones, ur NEGATIVE  NEGATIVE (mg/dL)   Protein, ur NEGATIVE  NEGATIVE (mg/dL)   Urobilinogen, UA 1.0  0.0 - 1.0 (mg/dL)  Nitrite NEGATIVE  NEGATIVE    Leukocytes, UA NEGATIVE  NEGATIVE      Assessment and Plan  26 y.o. G1P1 at 3.[redacted] weeks EGA by unsure LMP Offered patient pelvic exam, labs and u/s for early pregnancy abd pain to r/o ectopic. Explained amount of time expected for labs and ultrasound and that at this gestational age, we would likely not be able to see the pregnancy well on u/s and follow up visits could be expected. Pt states that she would rather not wait and that she doesn't think anything is wrong, would prefer to follow up for her regular prenatal care. As she does not appear in any discomfort or distress, I believe this is reasonable. She plans on prenatal care at [facility], will  follow up there ASAP or return here with worsening pain or vaginal bleeding.   Fraida Veldman 01/21/2012, 8:33 PM

## 2012-01-21 NOTE — ED Provider Notes (Signed)
History     CSN: [id number]  Arrival date & time 01/21/12  1728   First MD Initiated Contact with Patient 01/21/12 1832      Chief Complaint  Patient presents with  . Abdominal Pain    (Consider location/radiation/quality/duration/timing/severity/associated sxs/prior treatment) Patient is a 26 y.o. female presenting with cramps. The history is provided by the patient. No language interpreter was used.  Abdominal Cramping The primary symptoms of the illness include nausea. The primary symptoms of the illness do not include abdominal pain. The current episode started more than 2 days ago. The onset of the illness was gradual. The problem has been gradually worsening.  The patient states that she believes she is currently pregnant. Symptoms associated with the illness do not include chills or constipation. Significant associated medical issues do not include PUD.  Pt reports last period was mid February.  Pt thinks she may be pregnant.  Pt reports she has not missed a period yet  Past Medical History  Diagnosis Date  . Urinary tract infection     Past Surgical History  Procedure Date  . Cesarean section     No family history on file.  History  Substance Use Topics  . Smoking status: Never Smoker   . Smokeless tobacco: Never Used  . Alcohol Use: No    OB History    Grav Para Term Preterm Abortions TAB SAB Ect Mult Living                  Review of Systems  Constitutional: Negative for chills.  Gastrointestinal: Positive for nausea. Negative for abdominal pain and constipation.  All other systems reviewed and are negative.    Allergies  Review of patient's allergies indicates no known allergies.  Home Medications   Current Outpatient Rx  Name Route Sig Dispense Refill  . ACETAMINOPHEN-CODEINE #3 300-30 MG PO TABS Oral Take 1 tablet by mouth 3 (three) times daily as needed. For pain      BP 126/73  Pulse 97  Temp(Src) 98.3 F (36.8 C) (Oral)  Resp 18   SpO2 99%  Physical Exam  Nursing note and vitals reviewed. Constitutional: She is oriented to person, place, and time. She appears well-developed and well-nourished.  HENT:  Head: Normocephalic.  Eyes: Pupils are equal, round, and reactive to light.  Neck: Normal range of motion.  Cardiovascular: Normal rate and normal heart sounds.   Pulmonary/Chest: Effort normal.  Abdominal: Soft. There is no tenderness. There is no rebound and no guarding.  Musculoskeletal: Normal range of motion.  Neurological: She is alert and oriented to person, place, and time.  Skin: Skin is warm.  Psychiatric: She has a normal mood and affect.    ED Course  Procedures (including critical care time)  Labs Reviewed  PREGNANCY, URINE - Abnormal; Notable for the following:    Preg Test, Ur POSITIVE (*)    All other components within normal limits  URINALYSIS, ROUTINE W REFLEX MICROSCOPIC   No results found.   No diagnosis found.    MDM  No uti symptoms, no std symptoms.  Pt given rx for prenatal vitamins.  Pt advised to call her OB  [facility] for appointment.  Go to mau at [facility] hospital if pregnancy related problems.        Lonia Skinner [place], [place] 01/21/12 1913

## 2012-01-21 NOTE — Discharge Instructions (Signed)
ABCs of Pregnancy A Antepartum care is very important. Be sure you see your doctor and get prenatal care as soon as you think you are pregnant. At this time, you will be tested for infection, genetic abnormalities and potential problems with you and the pregnancy. This is the time to discuss diet, exercise, work, medications, labor, pain medication during labor and the possibility of a cesarean delivery. Ask any questions that may concern you. It is important to see your doctor regularly throughout your pregnancy. Avoid exposure to toxic substances and chemicals - such as cleaning solvents, lead and mercury, some insecticides, and paint. Pregnant women should avoid exposure to paint fumes, and fumes that cause you to feel ill, dizzy or faint. When possible, it is a good idea to have a pre-pregnancy consultation with your caregiver to begin some important recommendations your caregiver suggests such as, taking folic acid, exercising, quitting smoking, avoiding alcoholic beverages, etc. B Breastfeeding is the healthiest choice for both you and your baby. It has many nutritional benefits for the baby and health benefits for the mother. It also creates a very tight and loving bond between the baby and mother. Talk to your doctor, your family and friends, and your employer about how you choose to feed your baby and how they can support you in your decision. Not all birth defects can be prevented, but a woman can take actions that may increase her chance of having a healthy baby. Many birth defects happen very early in pregnancy, sometimes before a woman even knows she is pregnant. Birth defects or abnormalities of any child in your or the father's family should be discussed with your caregiver. Get a good support bra as your breast size changes. Wear it especially when you exercise and when nursing.  C Celebrate the news of your pregnancy with the your spouse/father and family. Childbirth classes are helpful to  take for you and the spouse/father because it helps to understand what happens during the pregnancy, labor and delivery. Cesarean delivery should be discussed with your doctor so you are prepared for that possibility. The pros and cons of circumcision if it is a boy, should be discussed with your pediatrician. Cigarette smoking during pregnancy can result in low birth weight babies. It has been associated with infertility, miscarriages, tubal pregnancies, infant death (mortality) and poor health (morbidity) in childhood. Additionally, cigarette smoking may cause long-term learning disabilities. If you smoke, you should try to quit before getting pregnant and not smoke during the pregnancy. Secondary smoke may also harm a mother and her developing baby. It is a good idea to ask people to stop smoking around you during your pregnancy and after the baby is born. Extra calcium is necessary when you are pregnant and is found in your prenatal vitamin, in dairy products, green leafy vegetables and in calcium supplements. D A healthy diet according to your current weight and height, along with vitamins and mineral supplements should be discussed with your caregiver. Domestic abuse or violence should be made known to your doctor right away to get the situation corrected. Drink more water when you exercise to keep hydrated. Discomfort of your back and legs usually develops and progresses from the middle of the second trimester through to delivery of the baby. This is because of the enlarging baby and uterus, which may also affect your balance. Do not take illegal drugs. Illegal drugs can seriously harm the baby and you. Drink extra fluids (water is best) throughout pregnancy to help   your body keep up with the increases in your blood volume. Drink at least 6 to 8 glasses of water, fruit juice, or milk each day. A good way to know you are drinking enough fluid is when your urine looks almost like clear water or is very light  yellow.  E Eat healthy to get the nutrients you and your unborn baby need. Your meals should include the five basic food groups. Exercise (30 minutes of light to moderate exercise a day) is important and encouraged during pregnancy, if there are no medical problems or problems with the pregnancy. Exercise that causes discomfort or dizziness should be stopped and reported to your caregiver. Emotions during pregnancy can change from being ecstatic to depression and should be understood by you, your partner and your family. F Fetal screening with ultrasound, amniocentesis and monitoring during pregnancy and labor is common and sometimes necessary. Take 400 micrograms of folic acid daily both before, when possible, and during the first few months of pregnancy to reduce the risk of birth defects of the brain and spine. All women who could possibly become pregnant should take a vitamin with folic acid, every day. It is also important to eat a healthy diet with fortified foods (enriched grain products, including cereals, rice, breads, and pastas) and foods with natural sources of folate (orange juice, green leafy vegetables, beans, peanuts, broccoli, asparagus, peas, and lentils). The father should be involved with all aspects of the pregnancy including, the prenatal care, childbirth classes, labor, delivery, and postpartum time. Fathers may also have emotional concerns about being a father, financial needs, and raising a family. G Genetic testing should be done appropriately. It is important to know your family and the father's history. If there have been problems with pregnancies or birth defects in your family, report these to your doctor. Also, genetic counselors can talk with you about the information you might need in making decisions about having a family. You can call a major medical center in your area for help in finding a board-certified genetic counselor. Genetic testing and counseling should be done  before pregnancy when possible, especially if there is a history of problems in the mother's or father's family. Certain ethnic backgrounds are more at risk for genetic defects. H Get familiar with the hospital where you will be having your baby. Get to know how long it takes to get there, the labor and delivery area, and the hospital procedures. Be sure your medical insurance is accepted there. Get your home ready for the baby including, clothes, the baby's room (when possible), furniture and car seat. Hand washing is important throughout the day, especially after handling raw meat and poultry, changing the baby's diaper or using the bathroom. This can help prevent the spread of many bacteria and viruses that cause infection. Your hair may become dry and thinner, but will return to normal a few weeks after the baby is born. Heartburn is a common problem that can be treated by taking antacids recommended by your caregiver, eating smaller meals 5 or 6 times a day, not drinking liquids when eating, drinking between meals and raising the head of your bed 2 to 3 inches. I Insurance to cover you, the baby, doctor and hospital should be reviewed so that you will be prepared to pay any costs not covered by your insurance plan. If you do not have medical insurance, there are usually clinics and services available for you in your community. Take 30 milligrams of iron during   your pregnancy as prescribed by your doctor to reduce the risk of low red blood cells (anemia) later in pregnancy. All women of childbearing age should eat a diet rich in iron. J There should be a joint effort for the mother, father and any other children to adapt to the pregnancy financially, emotionally, and psychologically during the pregnancy. Join a support group for moms-to-be. Or, join a class on parenting or childbirth. Have the family participate when possible. K Know your limits. Let your caregiver know if you experience any of the  following:   Pain of any kind.   Strong cramps.   You develop a lot of weight in a short period of time (5 pounds in 3 to 5 days).   Vaginal bleeding, leaking of amniotic fluid.   Headache, vision problems.   Dizziness, fainting, shortness of breath.   Chest pain.   Fever of 102 F (38.9 C) or higher.   Gush of clear fluid from your vagina.   Painful urination.   Domestic violence.   Irregular heartbeat (palpitations).   Rapid beating of the heart (tachycardia).   Constant feeling sick to your stomach (nauseous) and vomiting.   Trouble walking, fluid retention (edema).   Muscle weakness.   If your baby has decreased activity.   Persistent diarrhea.   Abnormal vaginal discharge.   Uterine contractions at 20-minute intervals.   Back pain that travels down your leg.  L Learn and practice that what you eat and drink should be in moderation and healthy for you and your baby. Legal drugs such as alcohol and caffeine are important issues for pregnant women. There is no safe amount of alcohol a woman can drink while pregnant. Fetal alcohol syndrome, a disorder characterized by growth retardation, facial abnormalities, and central nervous system dysfunction, is caused by a woman's use of alcohol during pregnancy. Caffeine, found in tea, coffee, soft drinks and chocolate, should also be limited. Be sure to read labels when trying to cut down on caffeine during pregnancy. More than 200 foods, beverages, and over-the-counter medications contain caffeine and have a high salt content! There are coffees and teas that do not contain caffeine. M Medical conditions such as diabetes, epilepsy, and high blood pressure should be treated and kept under control before pregnancy when possible, but especially during pregnancy. Ask your caregiver about any medications that may need to be changed or adjusted during pregnancy. If you are currently taking any medications, ask your caregiver if it  is safe to take them while you are pregnant or before getting pregnant when possible. Also, be sure to discuss any herbs or vitamins you are taking. They are medicines, too! Discuss with your doctor all medications, prescribed and over-the-counter, that you are taking. During your prenatal visit, discuss the medications your doctor may give you during labor and delivery. N Never be afraid to ask your doctor or caregiver questions about your health, the progress of the pregnancy, family problems, stressful situations, and recommendation for a pediatrician, if you do not have one. It is better to take all precautions and discuss any questions or concerns you may have during your office visits. It is a good idea to write down your questions before you visit the doctor. O Over-the-counter cough and cold remedies may contain alcohol or other ingredients that should be avoided during pregnancy. Ask your caregiver about prescription, herbs or over-the-counter medications that you are taking or may consider taking while pregnant.  P Physical activity during pregnancy can   benefit both you and your baby by lessening discomfort and fatigue, providing a sense of well-being, and increasing the likelihood of early recovery after delivery. Light to moderate exercise during pregnancy strengthens the belly (abdominal) and back muscles. This helps improve posture. Practicing yoga, walking, swimming, and cycling on a stationary bicycle are usually safe exercises for pregnant women. Avoid scuba diving, exercise at high altitudes (over 3000 feet), skiing, horseback riding, contact sports, etc. Always check with your doctor before beginning any kind of exercise, especially during pregnancy and especially if you did not exercise before getting pregnant. Q Queasiness, stomach upset and morning sickness are common during pregnancy. Eating a couple of crackers or dry toast before getting out of bed. Foods that you normally love may  make you feel sick to your stomach. You may need to substitute other nutritious foods. Eating 5 or 6 small meals a day instead of 3 large ones may make you feel better. Do not drink with your meals, drink between meals. Questions that you have should be written down and asked during your prenatal visits. R Read about and make plans to baby-proof your home. There are important tips for making your home a safer environment for your baby. Review the tips and make your home safer for you and your baby. Read food labels regarding calories, salt and fat content in the food. S Saunas, hot tubs, and steam rooms should be avoided while you are pregnant. Excessive high heat may be harmful during your pregnancy. Your caregiver will screen and examine you for sexually transmitted diseases and genetic disorders during your prenatal visits. Learn the signs of labor. Sexual relations while pregnant is safe unless there is a medical or pregnancy problem and your caregiver advises against it. T Traveling long distances should be avoided especially in the third trimester of your pregnancy. If you do have to travel out of state, be sure to take a copy of your medical records and medical insurance plan with you. You should not travel long distances without seeing your doctor first. Most airlines will not allow you to travel after 36 weeks of pregnancy. Toxoplasmosis is an infection caused by a parasite that can seriously harm an unborn baby. Avoid eating undercooked meat and handling cat litter. Be sure to wear gloves when gardening. Tingling of the hands and fingers is not unusual and is due to fluid retention. This will go away after the baby is born. U Womb (uterus) size increases during the first trimester. Your kidneys will begin to function more efficiently. This may cause you to feel the need to urinate more often. You may also leak urine when sneezing, coughing or laughing. This is due to the growing uterus pressing  against your bladder, which lies directly in front of and slightly under the uterus during the first few months of pregnancy. If you experience burning along with frequency of urination or bloody urine, be sure to tell your doctor. The size of your uterus in the third trimester may cause a problem with your balance. It is advisable to maintain good posture and avoid wearing high heels during this time. An ultrasound of your baby may be necessary during your pregnancy and is safe for you and your baby. V Vaccinations are an important concern for pregnant women. Get needed vaccines before pregnancy. Center for Disease Control (www.cdc.gov) has clear guidelines for the use of vaccines during pregnancy. Review the list, be sure to discuss it with your doctor. Prenatal vitamins are helpful   and healthy for you and the baby. Do not take extra vitamins except what is recommended. Taking too much of certain vitamins can cause overdose problems. Continuous vomiting should be reported to your caregiver. Varicose veins may appear especially if there is a family history of varicose veins. They should subside after the delivery of the baby. Support hose helps if there is leg discomfort. W Being overweight or underweight during pregnancy may cause problems. Try to get within 15 pounds of your ideal weight before pregnancy. Remember, pregnancy is not a time to be dieting! Do not stop eating or start skipping meals as your weight increases. Both you and your baby need the calories and nutrition you receive from a healthy diet. Be sure to consult with your doctor about your diet. There is a formula and diet plan available depending on whether you are overweight or underweight. Your caregiver or nutritionist can help and advise you if necessary. X Avoid X-rays. If you must have dental work or diagnostic tests, tell your dentist or physician that you are pregnant so that extra care can be taken. X-rays should only be taken when  the risks of not taking them outweigh the risk of taking them. If needed, only the minimum amount of radiation should be used. When X-rays are necessary, protective lead shields should be used to cover areas of the body that are not being X-rayed. Y Your baby loves you. Breastfeeding your baby creates a loving and very close bond between the two of you. Give your baby a healthy environment to live in while you are pregnant. Infants and children require constant care and guidance. Their health and safety should be carefully watched at all times. After the baby is born, rest or take a nap when the baby is sleeping. Z Get your ZZZs. Be sure to get plenty of rest. Resting on your side as often as possible, especially on your left side is advised. It provides the best circulation to your baby and helps reduce swelling. Try taking a nap for 30 to 45 minutes in the afternoon when possible. After the baby is born rest or take a nap when the baby is sleeping. Try elevating your feet for that amount of time when possible. It helps the circulation in your legs and helps reduce swelling.  Most information courtesy of the CDC. Document Released: 10/28/2005 Document Revised: 10/17/2011 Document Reviewed: 07/12/2009 [employer] Patient Information 2012 [employer]. 

## 2012-01-22 NOTE — MAU Provider Note (Signed)
Chart reviewed and agree with management and plan.  

## 2012-01-23 NOTE — ED Provider Notes (Signed)
Medical screening examination/treatment/procedure(s) were performed by non-physician practitioner and as supervising physician I was immediately available for consultation/collaboration.   Gwyneth Sprout, MD 01/23/12 1051

## 2012-02-06 LAB — OB RESULTS CONSOLE ANTIBODY SCREEN: Antibody Screen: NEGATIVE

## 2012-02-06 LAB — OB RESULTS CONSOLE RPR: RPR: NONREACTIVE

## 2012-04-21 ENCOUNTER — Inpatient Hospital Stay (HOSPITAL_COMMUNITY)
Admission: AD | Admit: 2012-04-21 | Discharge: 2012-04-21 | Disposition: A | Discharge status: Home / Self Care | Payer: BC Managed Care – PPO | Source: Ambulatory Visit | Attending: Obstetrics | Admitting: Obstetrics

## 2012-04-21 ENCOUNTER — Encounter (HOSPITAL_COMMUNITY): Payer: Self-pay | Admitting: *Deleted

## 2012-04-21 DIAGNOSIS — N949 Unspecified condition associated with female genital organs and menstrual cycle: Secondary | ICD-10-CM

## 2012-04-21 DIAGNOSIS — R109 Unspecified abdominal pain: Secondary | ICD-10-CM | POA: Insufficient documentation

## 2012-04-21 DIAGNOSIS — O99891 Other specified diseases and conditions complicating pregnancy: Secondary | ICD-10-CM | POA: Insufficient documentation

## 2012-04-21 DIAGNOSIS — R51 Headache: Secondary | ICD-10-CM | POA: Insufficient documentation

## 2012-04-21 DIAGNOSIS — R519 Headache, unspecified: Secondary | ICD-10-CM

## 2012-04-21 DIAGNOSIS — O26899 Other specified pregnancy related conditions, unspecified trimester: Secondary | ICD-10-CM

## 2012-04-21 LAB — COMPREHENSIVE METABOLIC PANEL
Albumin: 2.9 g/dL — ABNORMAL LOW (ref 3.5–5.2)
BUN: 7 mg/dL (ref 6–23)
Calcium: 9.1 mg/dL (ref 8.4–10.5)
Creatinine, Ser: 0.66 mg/dL (ref 0.50–1.10)
GFR calc Af Amer: >90 mL/min (ref >90)
Glucose, Bld: 85 mg/dL (ref 70–99)
Potassium: 3.8 mEq/L (ref 3.5–5.1)
Total Protein: 6.6 g/dL (ref 6.0–8.3)

## 2012-04-21 LAB — WET PREP, GENITAL
Clue Cells Wet Prep HPF POC: NONE SEEN
Trich, Wet Prep: NONE SEEN

## 2012-04-21 LAB — URINALYSIS, ROUTINE W REFLEX MICROSCOPIC
Bilirubin Urine: NEGATIVE
Ketones, ur: NEGATIVE mg/dL
Nitrite: NEGATIVE
Protein, ur: NEGATIVE mg/dL
Specific Gravity, Urine: >1.03 — ABNORMAL HIGH (ref 1.005–1.030)
Urobilinogen, UA: 0.2 mg/dL (ref 0.0–1.0)

## 2012-04-21 LAB — CBC
HCT: 30.4 % — ABNORMAL LOW (ref 36.0–46.0)
Hemoglobin: 10.4 g/dL — ABNORMAL LOW (ref 12.0–15.0)
MCV: 88.1 fL (ref 78.0–100.0)
RBC: 3.45 MIL/uL — ABNORMAL LOW (ref 3.87–5.11)
WBC: 8.7 10*3/uL (ref 4.0–10.5)

## 2012-04-21 MED ORDER — DEXAMETHASONE SODIUM PHOSPHATE 10 MG/ML IJ SOLN
10.0000 mg | Freq: Once | INTRAMUSCULAR | Status: AC
  Administered 2012-04-21: 10 mg via INTRAVENOUS
  Filled 2012-04-21: qty 1

## 2012-04-21 MED ORDER — PROCHLORPERAZINE EDISYLATE 5 MG/ML IJ SOLN: 10.0000 mg | Freq: Four times a day (QID) | INTRAMUSCULAR | Status: DC | PRN

## 2012-04-21 MED ORDER — LACTATED RINGERS IV BOLUS (SEPSIS): 1000.0000 mL | Freq: Once | INTRAVENOUS | Status: DC

## 2012-04-21 MED ORDER — DIPHENHYDRAMINE HCL 50 MG/ML IJ SOLN
25.0000 mg | Freq: Once | INTRAMUSCULAR | Status: AC
  Administered 2012-04-21: 25 mg via INTRAVENOUS
  Filled 2012-04-21: qty 1

## 2012-04-21 MED ORDER — BUTALBITAL-APAP-CAFFEINE 50-325-40 MG PO TABS: 1.0000 | ORAL_TABLET | Freq: Four times a day (QID) | ORAL | Status: AC | PRN

## 2012-04-21 NOTE — Progress Notes (Signed)
Cultures obtained,Dr. Clearance Coots to give order to send

## 2012-04-21 NOTE — MAU Note (Signed)
Pt complaining of pain in her abdomen and pain on the right side of  Her face. Pt states her abdomen started hurting about 12 and her face started hurting about 1500

## 2012-04-21 NOTE — MAU Provider Note (Signed)
[name] [id number] y.o.G2P1 @[redacted]w[redacted]d  by LMP Chief Complaint  Patient presents with  . Abdominal Pain     First Provider Initiated Contact with Patient 04/21/12 1617      SUBJECTIVE  HPI: HPI: Donna Conley is a 26 y.o. year old G2P1 female at [redacted]w[redacted]d weeks gestation who presents to MAU by EMS reporting right low abd pain 5/10 since 1200 and constant, throbbing right-sided headache wrapping from face around to back of head since 1500. She reports having had similar headaches in the past, but denies similar abd pain. She has had N/V throughout the pregnancy w/out exacerbation. She had spotting 2 days ago, but none today. She denies fever, chills, diarrhea, constipation, loss of appetite. Urinary Sx, vaginal discharge. EMT reports pt having panic attack en route.    Past Medical History  Diagnosis Date  . Urinary tract infection   . No pertinent past medical history    Past Surgical History  Procedure Date  . Cesarean section    History   Social History  . Marital Status: Single    Spouse Name: N/A    Number of Children: N/A  . Years of Education: N/A   Occupational History  . Not on file.   Social History Main Topics  . Smoking status: Never Smoker   . Smokeless tobacco: Never Used  . Alcohol Use: No  . Drug Use: No  . Sexually Active: Yes    Birth Control/ Protection: None   Other Topics Concern  . Not on file   Social History Narrative  . No narrative on file   No current facility-administered medications on file prior to encounter.   Current Outpatient Prescriptions on File Prior to Encounter  Medication Sig Dispense Refill  . acetaminophen-codeine (TYLENOL #3) 300-30 MG per tablet Take 1-2 tablets by mouth 3 (three) times daily as needed. For pain      . DISCONTD: mometasone (NASONEX) 50 MCG/ACT nasal spray Place 2 sprays into the nose daily.  17 g  12   No Known Allergies  ROS: Pertinent items in HPI  OBJECTIVE Blood pressure 117/71, pulse 85, temperature  97.9 F (36.6 C), temperature source Oral, resp. rate 18, last menstrual period 12/26/2011, SpO2 100.00%.  GENERAL: Well-developed, well-nourished female in mild distress, very anxious.  HEENT: Normocephalic, good dentition HEART: normal rate RESP: normal effort ABDOMEN: Soft, non-tender. No rebound. +BS x 4. Gravid, S=D. EXTREMITIES: Nontender, no edema NEURO: Alert and oriented SPECULUM EXAM: NEFG, physiologic discharge, no blood noted, cervix clean BIMANUAL: cervix closed; uterus non-tender, 18 week size; no adnexal tenderness or masses FHR: 148 by doppler  Toco: no UC's  LAB RESULTS Recent Results (from the past 168 hour(s))  URINALYSIS, ROUTINE W REFLEX MICROSCOPIC   Collection Time   04/21/12  4:05 PM      Component Value Range   Color, Urine YELLOW  YELLOW   APPearance CLEAR  CLEAR   Specific Gravity, Urine >1.030 (*) 1.005 - 1.030   pH 6.0  5.0 - 8.0   Glucose, UA NEGATIVE  NEGATIVE mg/dL   Hgb urine dipstick NEGATIVE  NEGATIVE   Bilirubin Urine NEGATIVE  NEGATIVE   Ketones, ur NEGATIVE  NEGATIVE mg/dL   Protein, ur NEGATIVE  NEGATIVE mg/dL   Urobilinogen, UA 0.2  0.0 - 1.0 mg/dL   Nitrite NEGATIVE  NEGATIVE   Leukocytes, UA NEGATIVE  NEGATIVE  WET PREP, GENITAL   Collection Time   04/21/12  5:00 PM      Component Value Range  Yeast Wet Prep HPF POC NONE SEEN  NONE SEEN   Trich, Wet Prep NONE SEEN  NONE SEEN   Clue Cells Wet Prep HPF POC NONE SEEN  NONE SEEN   WBC, Wet Prep HPF POC FEW (*) NONE SEEN  COMPREHENSIVE METABOLIC PANEL   Collection Time   04/21/12  5:23 PM      Component Value Range   Sodium 133 (*) 135 - 145 mEq/L   Potassium 3.8  3.5 - 5.1 mEq/L   Chloride 102  96 - 112 mEq/L   CO2 23  19 - 32 mEq/L   Glucose, Bld 85  70 - 99 mg/dL   BUN 7  6 - 23 mg/dL   Creatinine, Ser 1.61  0.50 - 1.10 mg/dL   Calcium 9.1  8.4 - 09.6 mg/dL   Total Protein 6.6  6.0 - 8.3 g/dL   Albumin 2.9 (*) 3.5 - 5.2 g/dL   AST 16  0 - 37 U/L   ALT 20  0 - 35 U/L    Alkaline Phosphatase 37 (*) 39 - 117 U/L   Total Bilirubin 0.2 (*) 0.3 - 1.2 mg/dL   GFR calc non Af Amer >90  >90 mL/min   GFR calc Af Amer >90  >90 mL/min  CBC   Collection Time   04/21/12  5:23 PM      Component Value Range   WBC 8.7  4.0 - 10.5 K/uL   RBC 3.45 (*) 3.87 - 5.11 MIL/uL   Hemoglobin 10.4 (*) 12.0 - 15.0 g/dL   HCT 04.5 (*) 40.9 - 81.1 %   MCV 88.1  78.0 - 100.0 fL   MCH 30.1  26.0 - 34.0 pg   MCHC 34.2  30.0 - 36.0 g/dL   RDW 91.4  78.2 - 95.6 %   Platelets 222  150 - 400 K/uL   IMAGING  Decadron, Compazine, Benadryl and IV bolus per Dr, Clearance Coots. HA and abd pain resolved.   ASSESSMENT 1. Headache in pregnancy   2. Round ligament pain   Low suspicion for appendicitis   PLAN D/C home per Dr. Clearance Coots Follow-up Information    Follow up with HARPER,CHARLES A, MD. (as scheduled)    Contact information:   [address] [place] [address] [phone]         Medication List  As of 04/28/2012  1:11 AM   START taking these medications         butalbital-acetaminophen-caffeine 50-325-40 MG per tablet   Commonly known as: FIORICET, ESGIC   Take 1-2 tablets by mouth every 6 (six) hours as needed for headache.         CONTINUE taking these medications         acetaminophen-codeine 300-30 MG per tablet   Commonly known as: TYLENOL #3      prenatal multivitamin Tabs          Where to get your medications    These are the prescriptions that you need to pick up.   You may get these medications from any pharmacy.         butalbital-acetaminophen-caffeine 50-325-40 MG per tablet          Appendicitis precautions   Dorathy Kinsman 04/21/2012 4:31 PM

## 2012-04-25 ENCOUNTER — Emergency Department (HOSPITAL_BASED_OUTPATIENT_CLINIC_OR_DEPARTMENT_OTHER)
Admission: EM | Admit: 2012-04-25 | Discharge: 2012-04-25 | Disposition: A | Discharge status: Home / Self Care | Payer: BC Managed Care – PPO | Attending: Emergency Medicine | Admitting: Emergency Medicine

## 2012-04-25 ENCOUNTER — Encounter (HOSPITAL_BASED_OUTPATIENT_CLINIC_OR_DEPARTMENT_OTHER): Payer: Self-pay | Admitting: *Deleted

## 2012-04-25 DIAGNOSIS — N898 Other specified noninflammatory disorders of vagina: Secondary | ICD-10-CM | POA: Insufficient documentation

## 2012-04-25 DIAGNOSIS — Z3490 Encounter for supervision of normal pregnancy, unspecified, unspecified trimester: Secondary | ICD-10-CM

## 2012-04-25 DIAGNOSIS — O99891 Other specified diseases and conditions complicating pregnancy: Secondary | ICD-10-CM | POA: Insufficient documentation

## 2012-04-25 NOTE — ED Notes (Signed)
D/c home- no new rx given 

## 2012-04-25 NOTE — ED Notes (Signed)
Pt states she is 4 months preg and noticed a "spot" on her vagina. Requesting that this be checked. Denies discharge or other s/s.

## 2012-04-25 NOTE — ED Provider Notes (Signed)
History     CSN: [id number]  Arrival date & time 04/25/12  1924   First MD Initiated Contact with Patient 04/25/12 1953     8:21 PM HPI Patient reports she is 4 months pregnant and has noticed an abnormal lesion on her vaginal area today. Denies any associated symptoms of pain, discharge, fever, nausea, vomiting, drainage, abscess, vaginal bleeding, vaginal pain, dysuria, hematuria, vulvar pruritus, vulvar edema. Patient is concerned for herpes.   HPI  Past Medical History  Diagnosis Date  . Urinary tract infection   . No pertinent past medical history     Past Surgical History  Procedure Date  . Cesarean section     History reviewed. No pertinent family history.  History  Substance Use Topics  . Smoking status: Never Smoker   . Smokeless tobacco: Never Used  . Alcohol Use: No    OB History    Grav Para Term Preterm Abortions TAB SAB Ect Mult Living   2 1        1       Review of Systems  Constitutional: Negative for fever and chills.  Gastrointestinal: Negative for nausea, vomiting and abdominal pain.  Genitourinary: Negative for dysuria, urgency, frequency, flank pain, vaginal bleeding, vaginal discharge, genital sores, vaginal pain and pelvic pain.       Vaginal lesions  All other systems reviewed and are negative.    Allergies  Review of patient's allergies indicates no known allergies.  Home Medications   Current Outpatient Rx  Name Route Sig Dispense Refill  . ACETAMINOPHEN-CODEINE #3 300-30 MG PO TABS Oral Take 1-2 tablets by mouth 3 (three) times daily as needed. For pain    . PRENATAL MULTIVITAMIN CH Oral Take 1 tablet by mouth daily.    Marland Kitchen BUTALBITAL-APAP-CAFFEINE 50-325-40 MG PO TABS Oral Take 1-2 tablets by mouth every 6 (six) hours as needed for headache. 20 tablet 1    BP 122/75  Pulse 86  Temp 98 F (36.7 C) (Oral)  Resp 20  Ht 5\' 3"  (1.6 m)  Wt 197 lb (89.359 kg)  BMI 34.90 kg/m2  SpO2 100%  LMP 12/26/2011  Physical Exam  Vitals  reviewed. Constitutional: She is oriented to person, place, and time. Vital signs are normal. She appears well-developed and well-nourished.  HENT:  Head: Normocephalic and atraumatic.  Eyes: Conjunctivae are normal. Pupils are equal, round, and reactive to light.  Neck: Normal range of motion. Neck supple.  Cardiovascular: Exam reveals no friction rub.   Pulmonary/Chest: She has no rhonchi.  Abdominal: Normal appearance and bowel sounds are normal. There is no tenderness. There is no rigidity and no guarding.  Genitourinary: Vagina normal. There is no tenderness on the right labia. There is no tenderness on the left labia.       Patient has possible small nontender erythematous lesions on labia minora however this does appear to be a normal variant of skin. No mass, drainage or edema  Musculoskeletal: Normal range of motion.  Neurological: She is alert and oriented to person, place, and time.  Skin: Skin is warm and dry. No rash noted. No erythema. No pallor.    ED Course  Procedures   MDM  Low suspicion for STD and  patient is completely asymptomatic. Patient reports a followup appointment with her OB/GYN on Tuesday, in 3 days. Recommended that she mentioned this to OB/GYN otherwise I do not feel patient needs treatment. The patient has normal exam otherwise.       Shams Fill  Cyndie Chime, PA-C 04/25/12 2049

## 2012-04-25 NOTE — Discharge Instructions (Signed)
Your exam is normal today . However if you begin to have pain, abnormal vaginal discharge, or a mass please followup with your Obstetrician sooner. This may only require a phone call.

## 2012-04-26 NOTE — ED Provider Notes (Signed)
Medical screening examination/treatment/procedure(s) were performed by non-physician practitioner and as supervising physician I was immediately available for consultation/collaboration.   Forbes Cellar, MD 04/26/12 [phone]

## 2012-06-08 ENCOUNTER — Other Ambulatory Visit: Payer: Self-pay | Admitting: Obstetrics

## 2012-06-08 DIAGNOSIS — Z3480 Encounter for supervision of other normal pregnancy, unspecified trimester: Secondary | ICD-10-CM

## 2012-06-17 ENCOUNTER — Ambulatory Visit (HOSPITAL_COMMUNITY)
Admission: RE | Admit: 2012-06-17 | Discharge: 2012-06-17 | Disposition: A | Discharge status: Home / Self Care | Payer: BC Managed Care – PPO | Source: Ambulatory Visit | Attending: Obstetrics | Admitting: Obstetrics

## 2012-06-17 DIAGNOSIS — Z3480 Encounter for supervision of other normal pregnancy, unspecified trimester: Secondary | ICD-10-CM

## 2012-06-17 DIAGNOSIS — Z3689 Encounter for other specified antenatal screening: Secondary | ICD-10-CM | POA: Insufficient documentation

## 2012-06-17 DIAGNOSIS — O34219 Maternal care for unspecified type scar from previous cesarean delivery: Secondary | ICD-10-CM | POA: Insufficient documentation

## 2012-07-12 ENCOUNTER — Encounter (HOSPITAL_COMMUNITY): Payer: Self-pay | Admitting: *Deleted

## 2012-07-12 ENCOUNTER — Inpatient Hospital Stay (HOSPITAL_COMMUNITY)
Admission: AD | Admit: 2012-07-12 | Discharge: 2012-07-12 | Disposition: A | Discharge status: Home / Self Care | Payer: Medicaid Other | Source: Ambulatory Visit | Attending: Obstetrics | Admitting: Obstetrics

## 2012-07-12 ENCOUNTER — Encounter (HOSPITAL_COMMUNITY): Payer: Self-pay

## 2012-07-12 DIAGNOSIS — K0889 Other specified disorders of teeth and supporting structures: Secondary | ICD-10-CM

## 2012-07-12 DIAGNOSIS — O99891 Other specified diseases and conditions complicating pregnancy: Secondary | ICD-10-CM | POA: Insufficient documentation

## 2012-07-12 DIAGNOSIS — K089 Disorder of teeth and supporting structures, unspecified: Secondary | ICD-10-CM | POA: Insufficient documentation

## 2012-07-12 DIAGNOSIS — H6690 Otitis media, unspecified, unspecified ear: Secondary | ICD-10-CM | POA: Insufficient documentation

## 2012-07-12 LAB — URINALYSIS, ROUTINE W REFLEX MICROSCOPIC
Hgb urine dipstick: NEGATIVE
Nitrite: NEGATIVE
Protein, ur: NEGATIVE mg/dL
Specific Gravity, Urine: 1.02 (ref 1.005–1.030)
Urobilinogen, UA: 0.2 mg/dL (ref 0.0–1.0)

## 2012-07-12 MED ORDER — OXYCODONE-ACETAMINOPHEN 5-325 MG PO TABS: 1.0000 | ORAL_TABLET | Freq: Once | ORAL | Status: DC

## 2012-07-12 MED ORDER — OXYCODONE-ACETAMINOPHEN 5-325 MG PO TABS
2.0000 | ORAL_TABLET | Freq: Once | ORAL | Status: AC
  Administered 2012-07-12: 2 via ORAL
  Filled 2012-07-12: qty 2

## 2012-07-12 MED ORDER — OXYCODONE-ACETAMINOPHEN 5-325 MG PO TABS: 1.0000 | ORAL_TABLET | Freq: Four times a day (QID) | ORAL | Status: AC | PRN

## 2012-07-12 MED ORDER — OXYCODONE-ACETAMINOPHEN 5-325 MG PO TABS: 2.0000 | ORAL_TABLET | ORAL | Status: AC | PRN

## 2012-07-12 MED ORDER — AMOXICILLIN 500 MG PO TABS: 500.0000 mg | ORAL_TABLET | Freq: Two times a day (BID) | ORAL | Status: AC

## 2012-07-12 MED ORDER — OXYCODONE-ACETAMINOPHEN 5-325 MG PO TABS
1.0000 | ORAL_TABLET | Freq: Once | ORAL | Status: AC
  Administered 2012-07-12: 1 via ORAL
  Filled 2012-07-12: qty 1

## 2012-07-12 NOTE — MAU Note (Signed)
Patient is in with c/o 3 days of increasingly worse headache (no relief from tylenol #3), right ear ache, right mouth/teeth pain, blurred vision and lower legs swelling. She states that she had some intermittent contractions yesterday. Denies vaginal bleeding or lof.

## 2012-07-12 NOTE — MAU Provider Note (Signed)
  History     CSN: [id number]  Arrival date and time: 07/12/12 0405   First Provider Initiated Contact with Patient 07/12/12 [phone]      Chief Complaint  Patient presents with  . Headache  . Otalgia  . Blurred Vision  . Leg Swelling  . Oral Swelling  . Dental Pain   HPI  Pt is here with report of tooth pain that resulted in headache and ear pain that started three days ago.  Pt was prescribed antibiotic for tooth pain and began taking it two days ago.  No report of fever, body ache, or chills.    Past Medical History  Diagnosis Date  . Urinary tract infection   . No pertinent past medical history     Past Surgical History  Procedure Date  . Cesarean section     No family history on file.  History  Substance Use Topics  . Smoking status: Never Smoker   . Smokeless tobacco: Never Used  . Alcohol Use: No    Allergies: No Known Allergies  Prescriptions prior to admission  Medication Sig Dispense Refill  . acetaminophen-codeine (TYLENOL #3) 300-30 MG per tablet Take 1-2 tablets by mouth 3 (three) times daily as needed. For pain      . clindamycin (CLEOCIN) 300 MG capsule Take 300 mg by mouth 3 (three) times daily.      . Prenatal Vit-Fe Fumarate-FA (PRENATAL MULTIVITAMIN) TABS Take 1 tablet by mouth daily.        Review of Systems  Constitutional: Negative.   HENT: Positive for ear pain. Negative for congestion, sore throat, tinnitus and ear discharge.        Tooth pain  Eyes: Negative.   Gastrointestinal: Negative.   Genitourinary: Negative.   Musculoskeletal: Negative.   Neurological: Headaches: Tooth pain.   Physical Exam   Blood pressure 124/76, pulse 95, temperature 97.8 F (36.6 C), temperature source Oral, resp. rate 18, height 5\' 2"  (1.575 m), weight 98.998 kg (218 lb 4 oz), last menstrual period 12/26/2011.  Physical Exam  Constitutional: She is oriented to person, place, and time. She appears well-developed and well-nourished. No distress.   Appears uncomfortable  HENT:  Head: Normocephalic.  Right Ear: External ear normal. Tympanic membrane is erythematous. Tympanic membrane is not perforated.  Left Ear: Tympanic membrane, external ear and ear canal normal.  Mouth/Throat:    Neck: Normal range of motion. Neck supple.  Cardiovascular: Normal rate, regular rhythm and normal heart sounds.   Respiratory: Effort normal and breath sounds normal. No respiratory distress.  GI: Soft. There is no tenderness.  Neurological: She is alert and oriented to person, place, and time. She has normal reflexes.  Skin: Skin is warm and dry.   FHR 130's, +accels Toco - non MAU Course  Procedures  Percocet 5/325  Assessment and Plan  Tooth Pain Ear Infection  Plan: DC to home RX Amoxicillin, DC Clindamycin Follow-up with dentist ASAP Percocet for pain  [facility] 07/12/2012, 4:58 AM

## 2012-07-12 NOTE — MAU Note (Signed)
Pt here earlier and treated with percocet for tooth pain. Pt stated it did not help and it is too early to take anymore. Last does at 6am can take q 6 hrs.

## 2012-07-12 NOTE — MAU Provider Note (Signed)
  History     CSN: [id number]  Arrival date and time: 07/12/12 4098   First Provider Initiated Contact with Patient 07/12/12 1036      Chief Complaint  Patient presents with  . Dental Pain   HPI  Pt isi [redacted]w[redacted]d pregnant with dental pain. Pt was seen early this morning(~5am) and given Percocet which eased off the pain until pt got home and tried to sleep.  The pain increased- pt was given Percocet to take 1 tablet every 6  Hours and not due for another dose. Pt is on antibiotics.  Past Medical History  Diagnosis Date  . Urinary tract infection   . No pertinent past medical history     Past Surgical History  Procedure Date  . Cesarean section     History reviewed. No pertinent family history.  History  Substance Use Topics  . Smoking status: Never Smoker   . Smokeless tobacco: Never Used  . Alcohol Use: No    Allergies: No Known Allergies  Prescriptions prior to admission  Medication Sig Dispense Refill  . amoxicillin (AMOXIL) 500 MG tablet Take 1 tablet (500 mg total) by mouth 2 (two) times daily.  14 tablet  0  . oxyCODONE-acetaminophen (PERCOCET/ROXICET) 5-325 MG per tablet Take 1 tablet by mouth every 6 (six) hours as needed for pain.  15 tablet  0  . Prenatal Vit-Fe Fumarate-FA (PRENATAL MULTIVITAMIN) TABS Take 1 tablet by mouth daily.        Review of Systems  Constitutional: Negative for fever and chills.  Gastrointestinal: Negative for nausea, vomiting and abdominal pain.  Neurological: Positive for headaches.   Physical Exam   Blood pressure 123/77, pulse 95, temperature 98.9 F (37.2 C), temperature source Oral, resp. rate 18, last menstrual period 12/26/2011.  Physical Exam  Vitals reviewed. Constitutional: She is oriented to person, place, and time. She appears well-developed and well-nourished.  HENT:  Head: Normocephalic.  Eyes: Pupils are equal, round, and reactive to light.  Neck: Normal range of motion. Neck supple.  Cardiovascular: Normal  rate.   Respiratory: Effort normal.  Musculoskeletal: Normal range of motion.  Neurological: She is alert and oriented to person, place, and time.  Skin: Skin is warm and dry.  Psychiatric: She has a normal mood and affect.    MAU Course  Procedures  2 percocet given in MAU with prescription for pt to have 2 percocet every 4 hours as needed and f/u with Dr. Clearance Coots on Tuesday for dental referral  Assessment and Plan  Toothache in pregnancy- pain management and antibiotics  LINEBERRY,SUSAN 07/12/2012, 10:37 AM

## 2012-09-06 ENCOUNTER — Encounter (HOSPITAL_COMMUNITY): Payer: Self-pay | Admitting: *Deleted

## 2012-09-06 ENCOUNTER — Inpatient Hospital Stay (HOSPITAL_COMMUNITY)
Admission: AD | Admit: 2012-09-06 | Discharge: 2012-09-06 | Disposition: A | Discharge status: Home / Self Care | Payer: Medicaid Other | Source: Ambulatory Visit | Attending: Obstetrics | Admitting: Obstetrics

## 2012-09-06 DIAGNOSIS — O4700 False labor before 37 completed weeks of gestation, unspecified trimester: Secondary | ICD-10-CM | POA: Insufficient documentation

## 2012-09-06 MED ORDER — NIFEDIPINE 10 MG PO CAPS
10.0000 mg | ORAL_CAPSULE | ORAL | Status: DC | PRN
  Administered 2012-09-06 (×2): 10 mg via ORAL
  Filled 2012-09-06 (×2): qty 1

## 2012-09-06 MED ORDER — MORPHINE SULFATE 10 MG/ML IJ SOLN
10.0000 mg | Freq: Once | INTRAMUSCULAR | Status: AC
  Administered 2012-09-06: 10 mg via INTRAMUSCULAR
  Filled 2012-09-06: qty 1

## 2012-09-06 MED ORDER — LACTATED RINGERS IV BOLUS (SEPSIS)
1000.0000 mL | Freq: Once | INTRAVENOUS | Status: AC
  Administered 2012-09-06: 1000 mL via INTRAVENOUS

## 2012-09-06 MED ORDER — PROMETHAZINE HCL 25 MG/ML IJ SOLN
25.0000 mg | Freq: Once | INTRAMUSCULAR | Status: AC
  Administered 2012-09-06: 25 mg via INTRAMUSCULAR
  Filled 2012-09-06: qty 1

## 2012-09-06 NOTE — MAU Note (Signed)
Pt reports contractions, denies bleeding or ROM.  

## 2012-09-06 NOTE — Progress Notes (Signed)
Dr. Clearance Coots notified of patients cervical exam 1, posterior, thick- essentially unchanged from prior exam. Pt says she feels cramping, although her contractions are much less in intensity.

## 2012-09-06 NOTE — MAU Note (Signed)
Pt says she feels a lot better now than when she came in.

## 2012-09-15 ENCOUNTER — Encounter (HOSPITAL_COMMUNITY): Payer: Self-pay | Admitting: Pharmacist

## 2012-09-17 ENCOUNTER — Other Ambulatory Visit: Payer: Self-pay | Admitting: Obstetrics

## 2012-09-22 ENCOUNTER — Encounter (HOSPITAL_COMMUNITY)
Admission: RE | Admit: 2012-09-22 | Discharge: 2012-09-22 | Disposition: A | Discharge status: Home / Self Care | Payer: Medicaid Other | Source: Ambulatory Visit | Attending: Obstetrics | Admitting: Obstetrics

## 2012-09-22 ENCOUNTER — Encounter (HOSPITAL_COMMUNITY): Payer: Self-pay

## 2012-09-22 LAB — CBC
HCT: 32.2 % — ABNORMAL LOW (ref 36.0–46.0)
Hemoglobin: 10.8 g/dL — ABNORMAL LOW (ref 12.0–15.0)
MCV: 86.6 fL (ref 78.0–100.0)
RBC: 3.72 MIL/uL — ABNORMAL LOW (ref 3.87–5.11)
WBC: 7.1 10*3/uL (ref 4.0–10.5)

## 2012-09-22 LAB — SURGICAL PCR SCREEN
MRSA, PCR: NEGATIVE
Staphylococcus aureus: NEGATIVE

## 2012-09-22 NOTE — Patient Instructions (Addendum)
   Your procedure is scheduled ZO:[id number] November 14th  Enter through the Main Entrance of [facility] at:10:15am Pick up the phone at the desk and dial [phone] and inform us of your arrival.  Please call this number if you have any problems the morning of surgery: [phone]  Remember: Do not eat food after midnight:Wednesday You may drink clear liquids until 7:30am then nothing   Do not wear jewelry, make-up, or FINGER nail polish No metal in your hair or on your body. Do not wear lotions, powders, perfumes. You may wear deodorant.  Please use your CHG wash as directed prior to surgery.  Do not shave anywhere for at least 12 hours prior to first CHG shower.  Do not bring valuables to the hospital.   Leave suitcase in the car. After Surgery it may be brought to your room. For patients being admitted to the hospital, checkout time is 11:00am the day of discharge.

## 2012-09-24 ENCOUNTER — Encounter (HOSPITAL_COMMUNITY)
Admission: AD | Disposition: A | Discharge status: Home / Self Care | Payer: Self-pay | Source: Ambulatory Visit | Attending: Obstetrics

## 2012-09-24 ENCOUNTER — Inpatient Hospital Stay (HOSPITAL_COMMUNITY): Payer: Medicaid Other

## 2012-09-24 ENCOUNTER — Encounter (HOSPITAL_COMMUNITY): Payer: Self-pay | Admitting: Anesthesiology

## 2012-09-24 ENCOUNTER — Inpatient Hospital Stay (HOSPITAL_COMMUNITY)
Admission: AD | Admit: 2012-09-24 | Discharge: 2012-09-26 | DRG: 766 | Disposition: A | Discharge status: Home / Self Care | Payer: Medicaid Other | Source: Ambulatory Visit | Attending: Obstetrics | Admitting: Obstetrics

## 2012-09-24 ENCOUNTER — Other Ambulatory Visit: Payer: Self-pay | Admitting: Obstetrics

## 2012-09-24 ENCOUNTER — Encounter: Payer: Self-pay | Admitting: Obstetrics

## 2012-09-24 ENCOUNTER — Encounter (HOSPITAL_COMMUNITY): Payer: Self-pay

## 2012-09-24 ENCOUNTER — Encounter (HOSPITAL_COMMUNITY): Payer: Self-pay | Admitting: *Deleted

## 2012-09-24 DIAGNOSIS — Z01812 Encounter for preprocedural laboratory examination: Secondary | ICD-10-CM

## 2012-09-24 DIAGNOSIS — Z98891 History of uterine scar from previous surgery: Secondary | ICD-10-CM

## 2012-09-24 DIAGNOSIS — O34219 Maternal care for unspecified type scar from previous cesarean delivery: Principal | ICD-10-CM | POA: Diagnosis not present

## 2012-09-24 SURGERY — Surgical Case
Anesthesia: Spinal | Site: Abdomen | Wound class: Clean Contaminated

## 2012-09-24 MED ORDER — WITCH HAZEL-GLYCERIN EX PADS: 1.0000 "application " | MEDICATED_PAD | CUTANEOUS | Status: DC | PRN

## 2012-09-24 MED ORDER — NALBUPHINE HCL 10 MG/ML IJ SOLN: 5.0000 mg | INTRAMUSCULAR | Status: DC | PRN

## 2012-09-24 MED ORDER — PRENATAL MULTIVITAMIN CH
1.0000 | ORAL_TABLET | Freq: Every day | ORAL | Status: DC
  Administered 2012-09-25: 1 via ORAL
  Filled 2012-09-24 (×2): qty 1

## 2012-09-24 MED ORDER — MAGNESIUM HYDROXIDE 400 MG/5ML PO SUSP: 30.0000 mL | ORAL | Status: DC | PRN

## 2012-09-24 MED ORDER — MEPERIDINE HCL 25 MG/ML IJ SOLN
INTRAMUSCULAR | Status: AC
  Filled 2012-09-24: qty 1

## 2012-09-24 MED ORDER — ONDANSETRON HCL 4 MG/2ML IJ SOLN
INTRAMUSCULAR | Status: DC | PRN
  Administered 2012-09-24: 4 mg via INTRAVENOUS

## 2012-09-24 MED ORDER — KETOROLAC TROMETHAMINE 60 MG/2ML IM SOLN
60.0000 mg | Freq: Once | INTRAMUSCULAR | Status: AC | PRN
  Administered 2012-09-24: 60 mg via INTRAMUSCULAR

## 2012-09-24 MED ORDER — LACTATED RINGERS IV SOLN
INTRAVENOUS | Status: DC
  Administered 2012-09-25 (×2): via INTRAVENOUS

## 2012-09-24 MED ORDER — DIPHENHYDRAMINE HCL 25 MG PO CAPS: 25.0000 mg | ORAL_CAPSULE | Freq: Four times a day (QID) | ORAL | Status: DC | PRN

## 2012-09-24 MED ORDER — DIPHENHYDRAMINE HCL 25 MG PO CAPS: 25.0000 mg | ORAL_CAPSULE | ORAL | Status: DC | PRN

## 2012-09-24 MED ORDER — SIMETHICONE 80 MG PO CHEW: 80.0000 mg | CHEWABLE_TABLET | ORAL | Status: DC | PRN

## 2012-09-24 MED ORDER — NALOXONE HCL 0.4 MG/ML IJ SOLN: 0.4000 mg | INTRAMUSCULAR | Status: DC | PRN

## 2012-09-24 MED ORDER — LACTATED RINGERS IV SOLN
Freq: Once | INTRAVENOUS | Status: AC
  Administered 2012-09-24 (×4): via INTRAVENOUS

## 2012-09-24 MED ORDER — SODIUM CHLORIDE 0.9 % IJ SOLN: 3.0000 mL | INTRAMUSCULAR | Status: DC | PRN

## 2012-09-24 MED ORDER — OXYTOCIN 10 UNIT/ML IJ SOLN
INTRAMUSCULAR | Status: AC
  Filled 2012-09-24: qty 4

## 2012-09-24 MED ORDER — SCOPOLAMINE 1 MG/3DAYS TD PT72
1.0000 | MEDICATED_PATCH | Freq: Once | TRANSDERMAL | Status: DC
  Administered 2012-09-24: 1.5 mg via TRANSDERMAL

## 2012-09-24 MED ORDER — PROMETHAZINE HCL 25 MG/ML IJ SOLN: 6.2500 mg | INTRAMUSCULAR | Status: DC | PRN

## 2012-09-24 MED ORDER — SCOPOLAMINE 1 MG/3DAYS TD PT72
MEDICATED_PATCH | TRANSDERMAL | Status: AC
  Administered 2012-09-24: 1.5 mg via TRANSDERMAL
  Filled 2012-09-24: qty 1

## 2012-09-24 MED ORDER — ONDANSETRON HCL 4 MG PO TABS: 4.0000 mg | ORAL_TABLET | ORAL | Status: DC | PRN

## 2012-09-24 MED ORDER — OXYTOCIN 10 UNIT/ML IJ SOLN
40.0000 [IU] | INTRAVENOUS | Status: DC | PRN
  Administered 2012-09-24: 40 [IU] via INTRAVENOUS

## 2012-09-24 MED ORDER — FENTANYL CITRATE 0.05 MG/ML IJ SOLN
INTRAMUSCULAR | Status: DC | PRN
  Administered 2012-09-24: 12.5 ug via INTRATHECAL

## 2012-09-24 MED ORDER — ONDANSETRON HCL 4 MG/2ML IJ SOLN: 4.0000 mg | INTRAMUSCULAR | Status: DC | PRN

## 2012-09-24 MED ORDER — CEFAZOLIN SODIUM-DEXTROSE 2-3 GM-% IV SOLR
INTRAVENOUS | Status: AC
  Filled 2012-09-24: qty 50

## 2012-09-24 MED ORDER — TETANUS-DIPHTH-ACELL PERTUSSIS 5-2.5-18.5 LF-MCG/0.5 IM SUSP
0.5000 mL | Freq: Once | INTRAMUSCULAR | Status: AC
  Administered 2012-09-25: 0.5 mL via INTRAMUSCULAR
  Filled 2012-09-24: qty 0.5

## 2012-09-24 MED ORDER — EPHEDRINE 5 MG/ML INJ
INTRAVENOUS | Status: AC
  Filled 2012-09-24: qty 10

## 2012-09-24 MED ORDER — DIPHENHYDRAMINE HCL 50 MG/ML IJ SOLN: 12.5000 mg | INTRAMUSCULAR | Status: DC | PRN

## 2012-09-24 MED ORDER — LANOLIN HYDROUS EX OINT: 1.0000 "application " | TOPICAL_OINTMENT | CUTANEOUS | Status: DC | PRN

## 2012-09-24 MED ORDER — HYDROMORPHONE HCL PF 1 MG/ML IJ SOLN
0.2500 mg | INTRAMUSCULAR | Status: DC | PRN
  Administered 2012-09-24 (×2): 0.5 mg via INTRAVENOUS

## 2012-09-24 MED ORDER — ONDANSETRON HCL 4 MG/2ML IJ SOLN
INTRAMUSCULAR | Status: AC
  Filled 2012-09-24: qty 2

## 2012-09-24 MED ORDER — HYDROMORPHONE HCL PF 1 MG/ML IJ SOLN
INTRAMUSCULAR | Status: AC
  Administered 2012-09-24: 0.5 mg via INTRAVENOUS
  Filled 2012-09-24: qty 1

## 2012-09-24 MED ORDER — METOCLOPRAMIDE HCL 5 MG/ML IJ SOLN: 10.0000 mg | Freq: Three times a day (TID) | INTRAMUSCULAR | Status: DC | PRN

## 2012-09-24 MED ORDER — PHENYLEPHRINE HCL 10 MG/ML IJ SOLN
INTRAMUSCULAR | Status: DC | PRN
  Administered 2012-09-24 (×3): 80 ug via INTRAVENOUS
  Administered 2012-09-24 (×2): 40 ug via INTRAVENOUS
  Administered 2012-09-24: 80 ug via INTRAVENOUS

## 2012-09-24 MED ORDER — FERROUS SULFATE 325 (65 FE) MG PO TABS
325.0000 mg | ORAL_TABLET | Freq: Two times a day (BID) | ORAL | Status: DC
  Administered 2012-09-25 – 2012-09-26 (×3): 325 mg via ORAL
  Filled 2012-09-24 (×3): qty 1

## 2012-09-24 MED ORDER — IBUPROFEN 600 MG PO TABS
600.0000 mg | ORAL_TABLET | Freq: Four times a day (QID) | ORAL | Status: DC
  Administered 2012-09-24 – 2012-09-26 (×7): 600 mg via ORAL
  Filled 2012-09-24 (×7): qty 1

## 2012-09-24 MED ORDER — OXYTOCIN 40 UNITS IN LACTATED RINGERS INFUSION - SIMPLE MED: 62.5000 mL/h | INTRAVENOUS | Status: AC

## 2012-09-24 MED ORDER — MEPERIDINE HCL 25 MG/ML IJ SOLN: 6.2500 mg | INTRAMUSCULAR | Status: DC | PRN

## 2012-09-24 MED ORDER — KETOROLAC TROMETHAMINE 30 MG/ML IJ SOLN: 30.0000 mg | Freq: Four times a day (QID) | INTRAMUSCULAR | Status: AC | PRN

## 2012-09-24 MED ORDER — SODIUM CHLORIDE 0.9 % IV SOLN: 1.0000 ug/kg/h | INTRAVENOUS | Status: DC | PRN

## 2012-09-24 MED ORDER — EPHEDRINE SULFATE 50 MG/ML IJ SOLN
INTRAMUSCULAR | Status: DC | PRN
  Administered 2012-09-24 (×3): 10 mg via INTRAVENOUS
  Administered 2012-09-24 (×2): 5 mg via INTRAVENOUS

## 2012-09-24 MED ORDER — ONDANSETRON HCL 4 MG/2ML IJ SOLN: 4.0000 mg | Freq: Three times a day (TID) | INTRAMUSCULAR | Status: DC | PRN

## 2012-09-24 MED ORDER — MORPHINE SULFATE (PF) 0.5 MG/ML IJ SOLN
INTRAMUSCULAR | Status: DC | PRN
  Administered 2012-09-24: .1 ug via INTRATHECAL

## 2012-09-24 MED ORDER — KETOROLAC TROMETHAMINE 30 MG/ML IJ SOLN: 15.0000 mg | Freq: Once | INTRAMUSCULAR | Status: DC | PRN

## 2012-09-24 MED ORDER — PHENYLEPHRINE 40 MCG/ML (10ML) SYRINGE FOR IV PUSH (FOR BLOOD PRESSURE SUPPORT)
PREFILLED_SYRINGE | INTRAVENOUS | Status: AC
  Filled 2012-09-24: qty 10

## 2012-09-24 MED ORDER — DIBUCAINE 1 % RE OINT: 1.0000 "application " | TOPICAL_OINTMENT | RECTAL | Status: DC | PRN

## 2012-09-24 MED ORDER — DIPHENHYDRAMINE HCL 50 MG/ML IJ SOLN: 25.0000 mg | INTRAMUSCULAR | Status: DC | PRN

## 2012-09-24 MED ORDER — SENNOSIDES-DOCUSATE SODIUM 8.6-50 MG PO TABS
2.0000 | ORAL_TABLET | Freq: Every day | ORAL | Status: DC
  Administered 2012-09-24 – 2012-09-25 (×2): 2 via ORAL

## 2012-09-24 MED ORDER — MORPHINE SULFATE 0.5 MG/ML IJ SOLN
INTRAMUSCULAR | Status: AC
  Filled 2012-09-24: qty 10

## 2012-09-24 MED ORDER — ZOLPIDEM TARTRATE 5 MG PO TABS: 5.0000 mg | ORAL_TABLET | Freq: Every evening | ORAL | Status: DC | PRN

## 2012-09-24 MED ORDER — KETOROLAC TROMETHAMINE 60 MG/2ML IM SOLN
INTRAMUSCULAR | Status: AC
  Administered 2012-09-24: 60 mg via INTRAMUSCULAR
  Filled 2012-09-24: qty 2

## 2012-09-24 MED ORDER — CEFAZOLIN SODIUM-DEXTROSE 2-3 GM-% IV SOLR
2.0000 g | INTRAVENOUS | Status: AC
  Administered 2012-09-24: 2 g via INTRAVENOUS

## 2012-09-24 MED ORDER — LACTATED RINGERS IV SOLN: INTRAVENOUS | Status: DC

## 2012-09-24 MED ORDER — DEXTROSE 5 % IV SOLN
2.0000 g | Freq: Four times a day (QID) | INTRAVENOUS | Status: AC
  Administered 2012-09-24 – 2012-09-25 (×4): 2 g via INTRAVENOUS
  Filled 2012-09-24 (×4): qty 2

## 2012-09-24 MED ORDER — OXYCODONE-ACETAMINOPHEN 5-325 MG PO TABS
1.0000 | ORAL_TABLET | ORAL | Status: DC | PRN
  Administered 2012-09-25: 1 via ORAL
  Administered 2012-09-25: 2 via ORAL
  Administered 2012-09-25 – 2012-09-26 (×4): 1 via ORAL
  Filled 2012-09-24: qty 2
  Filled 2012-09-24 (×5): qty 1

## 2012-09-24 MED ORDER — BUPIVACAINE IN DEXTROSE 0.75-8.25 % IT SOLN
INTRATHECAL | Status: DC | PRN
  Administered 2012-09-24: 10.5 mg via INTRATHECAL

## 2012-09-24 MED ORDER — FENTANYL CITRATE 0.05 MG/ML IJ SOLN
INTRAMUSCULAR | Status: AC
  Filled 2012-09-24: qty 2

## 2012-09-24 MED ORDER — MEASLES, MUMPS & RUBELLA VAC ~~LOC~~ INJ: 0.5000 mL | INJECTION | Freq: Once | SUBCUTANEOUS | Status: DC

## 2012-09-24 SURGICAL SUPPLY — 41 items
CANISTER WOUND CARE 500ML ATS (WOUND CARE) IMPLANT
CLOTH BEACON ORANGE TIMEOUT ST (SAFETY) ×2 IMPLANT
CONTAINER PREFILL 10% NBF 15ML (MISCELLANEOUS) ×4 IMPLANT
DERMABOND ADVANCED (GAUZE/BANDAGES/DRESSINGS) ×1
DERMABOND ADVANCED .7 DNX12 (GAUZE/BANDAGES/DRESSINGS) ×1 IMPLANT
DRAPE SURG 17X23 STRL (DRAPES) ×2 IMPLANT
DRSG COVADERM 4X10 (GAUZE/BANDAGES/DRESSINGS) ×2 IMPLANT
DRSG VAC ATS LRG SENSATRAC (GAUZE/BANDAGES/DRESSINGS) IMPLANT
DRSG VAC ATS MED SENSATRAC (GAUZE/BANDAGES/DRESSINGS) IMPLANT
DRSG VAC ATS SM SENSATRAC (GAUZE/BANDAGES/DRESSINGS) IMPLANT
DURAPREP 26ML APPLICATOR (WOUND CARE) ×2 IMPLANT
ELECT REM PT RETURN 9FT ADLT (ELECTROSURGICAL) ×2
ELECTRODE REM PT RTRN 9FT ADLT (ELECTROSURGICAL) ×1 IMPLANT
EXTRACTOR VACUUM M CUP 4 TUBE (SUCTIONS) ×2 IMPLANT
GLOVE BIO SURGEON STRL SZ8 (GLOVE) ×4 IMPLANT
GOWN PREVENTION PLUS LG XLONG (DISPOSABLE) ×4 IMPLANT
GOWN PREVENTION PLUS XLARGE (GOWN DISPOSABLE) ×2 IMPLANT
KIT ABG SYR 3ML LUER SLIP (SYRINGE) IMPLANT
NEEDLE HYPO 25X5/8 SAFETYGLIDE (NEEDLE) ×2 IMPLANT
NS IRRIG 1000ML POUR BTL (IV SOLUTION) ×2 IMPLANT
PACK C SECTION WH (CUSTOM PROCEDURE TRAY) ×2 IMPLANT
PAD OB MATERNITY 4.3X12.25 (PERSONAL CARE ITEMS) ×2 IMPLANT
RTRCTR C-SECT PINK 25CM LRG (MISCELLANEOUS) IMPLANT
SLEEVE SCD COMPRESS KNEE MED (MISCELLANEOUS) ×2 IMPLANT
STAPLER VISISTAT 35W (STAPLE) ×2 IMPLANT
SUT GUT PLAIN 0 CT-3 TAN 27 (SUTURE) IMPLANT
SUT MNCRL 0 VIOLET CTX 36 (SUTURE) ×4 IMPLANT
SUT MNCRL AB 4-0 PS2 18 (SUTURE) IMPLANT
SUT MON AB 2-0 CT1 27 (SUTURE) ×2 IMPLANT
SUT MON AB 3-0 SH 27 (SUTURE)
SUT MON AB 3-0 SH27 (SUTURE) IMPLANT
SUT MONOCRYL 0 CTX 36 (SUTURE) ×4
SUT PDS AB 0 CTX 60 (SUTURE) ×4 IMPLANT
SUT PLAIN 2 0 XLH (SUTURE) IMPLANT
SUT VIC AB 0 CTX 36 (SUTURE)
SUT VIC AB 0 CTX36XBRD ANBCTRL (SUTURE) IMPLANT
SUT VIC AB 2-0 CT1 27 (SUTURE)
SUT VIC AB 2-0 CT1 TAPERPNT 27 (SUTURE) IMPLANT
TOWEL OR 17X24 6PK STRL BLUE (TOWEL DISPOSABLE) ×4 IMPLANT
TRAY FOLEY CATH 14FR (SET/KITS/TRAYS/PACK) ×2 IMPLANT
WATER STERILE IRR 1000ML POUR (IV SOLUTION) ×2 IMPLANT

## 2012-09-24 NOTE — Addendum Note (Signed)
Addendum  created 09/24/12 1842 by Renford Dills, CRNA   Modules edited:Notes Section

## 2012-09-24 NOTE — H&P (Signed)
  Donna Conley is a 26 y.o. female presenting for repeat C/S. Maternal Medical History:  Fetal activity: Perceived fetal activity is normal.   Last perceived fetal movement was within the past hour.    Prenatal complications: no prenatal complications Prenatal Complications - Diabetes: none.    OB History    Grav Para Term Preterm Abortions TAB SAB Ect Mult Living   2 1        1      Past Medical History  Diagnosis Date  . Urinary tract infection   . No pertinent past medical history    Past Surgical History  Procedure Date  . Cesarean section   . Cesarean section 2009    General Anesthesia   Family History: family history is not on file. Social History:  reports that she has never smoked. She has never used smokeless tobacco. She reports that she does not drink alcohol or use illicit drugs.   Prenatal Transfer Tool  Maternal Diabetes: No Genetic Screening: Normal Maternal Ultrasounds/Referrals: Normal Fetal Ultrasounds or other Referrals:  None Maternal Substance Abuse:  No Significant Maternal Medications:  Meds include: Other:  Significant Maternal Lab Results:  Lab values include: Other:  Other Comments:  None  Review of Systems  All other systems reviewed and are negative.      Last menstrual period 12/26/2011. Maternal Exam:  Abdomen: Patient reports no abdominal tenderness. Surgical scars: low transverse.   Introitus: not evaluated.   Cervix: not evaluated.   Physical Exam  Nursing note and vitals reviewed. Constitutional: She is oriented to person, place, and time. She appears well-developed and well-nourished.  HENT:  Head: Normocephalic and atraumatic.  Eyes: Conjunctivae normal are normal. Pupils are equal, round, and reactive to light.  Neck: Normal range of motion. Neck supple.  Cardiovascular: Normal rate and regular rhythm.   Respiratory: Effort normal.  GI: Soft.  Musculoskeletal: Normal range of motion.  Neurological: She is alert and  oriented to person, place, and time.  Skin: Skin is warm and dry.  Psychiatric: She has a normal mood and affect. Her behavior is normal. Judgment and thought content normal.    Prenatal labs: ABO, Rh: --/--/B POS (11/12 0850) Antibody: NEG (11/12 0843) Rubella: Immune (03/28 0000) RPR: NON REACTIVE (11/12 0843)  HBsAg: Negative (03/28 0000)  HIV: Non-reactive (03/28 0000)  GBS:     Assessment/Plan: 39 weeks.  Previous C/S.  Desires repeat C/S.   Jhon Mallozzi A 09/24/2012, 10:16 AM

## 2012-09-24 NOTE — Op Note (Signed)
Cesarean Section Procedure Note   [name]   09/24/2012  Indications: Scheduled Proceedure/Maternal Request   Pre-operative Diagnosis: previous c/s; edc 11/21.   Post-operative Diagnosis: Same   Surgeon: Coral Ceo A  Assistants: Antionette Char  Anesthesia: spinal  Procedure Details:  The patient was seen in the Holding Room. The risks, benefits, complications, treatment options, and expected outcomes were discussed with the patient. The patient concurred with the proposed plan, giving informed consent. The patient was identified as Donna Conley and the procedure verified as C-Section Delivery. A Time Out was held and the above information confirmed.  After induction of anesthesia, the patient was draped and prepped in the usual sterile manner. A transverse incision was made and carried down through the subcutaneous tissue to the fascia. The fascial incision was made and extended transversely. The fascia was separated from the underlying rectus tissue superiorly and inferiorly. The peritoneum was identified and entered. The peritoneal incision was extended longitudinally. The utero-vesical peritoneal reflection was incised transversely and the bladder flap was bluntly freed from the lower uterine segment. A low transverse uterine incision was made. Delivered from cephalic presentation was a 3390 gram living newborn female infant(s). APGAR (1 MIN): 7   APGAR (5 MINS): 8   APGAR (10 MINS):    A cord ph was not sent. The umbilical cord was clamped and cut cord. A sample was obtained for evaluation. The placenta was removed Intact and appeared normal.  The uterine incision was closed with running locked sutures of 1-0 Monocryl. A second imbricating layer of the same suture was placed.  Hemostasis was observed. The paracolic gutters were irrigated. The parieto peritoneum was closed in a running fashion with 2-0 Vicryl.  The fascia was then reapproximated with running sutures of 0  Vicryl.  The skin was closed with staples.  Instrument, sponge, and needle counts were correct prior the abdominal closure and were correct at the conclusion of the case.    Findings:   Estimated Blood Loss:  Total IV Fluids:   Urine Output: 100CC OF clear urine  Specimens: none  Complications: no complications  Disposition: PACU - hemodynamically stable.  Maternal Condition: stable   Baby condition / location:  nursery-stable    Signed: Surgeon(s): Brock Bad, MD Antionette Char, MD

## 2012-09-24 NOTE — Anesthesia Postprocedure Evaluation (Signed)
  Anesthesia Post-op Note  Patient: Donna Conley  Procedure(s) Performed: Procedure(s) (LRB) with comments: CESAREAN SECTION (N/A)  Patient Location: Mother/Baby  Anesthesia Type:Spinal  Level of Consciousness: awake  Airway and Oxygen Therapy: Patient Spontanous Breathing  Post-op Pain: mild  Post-op Assessment: Patient's Cardiovascular Status Stable and Respiratory Function Stable  Post-op Vital Signs: stable  Complications: No apparent anesthesia complications

## 2012-09-24 NOTE — Transfer of Care (Signed)
Immediate Anesthesia Transfer of Care Note  Patient: Donna Conley  Procedure(s) Performed: Procedure(s) (LRB) with comments: CESAREAN SECTION (N/A)  Patient Location: PACU  Anesthesia Type:Spinal  Level of Consciousness: awake, alert  and oriented  Airway & Oxygen Therapy: Patient Spontanous Breathing  Post-op Assessment: Report given to PACU RN and Post -op Vital signs reviewed and stable  Post vital signs: Reviewed and stable  Complications: No apparent anesthesia complications

## 2012-09-24 NOTE — Consult Note (Signed)
Neonatology Note:  Attendance at C-section:  I was asked to attend this repeat C/S at term. The mother is a G2P1 B pos, GBS not on chart with an uncomplicated pregnancy. ROM at delivery, fluid clear. Infant vigorous with good spontaneous cry and tone. Needed only minimal bulb suctioning. Ap 7/8. Lungs clear to ausc in DR. Tone slightly decreased at 5 min, but crying vigorously. To CN to care of Pediatrician.  Jovon Winterhalter, MD  

## 2012-09-24 NOTE — Anesthesia Procedure Notes (Signed)
Spinal  Patient location during procedure: OR Start time: 09/24/2012 12:02 PM End time: 09/24/2012 12:06 PM Reason for block: procedure for pain Staffing Anesthesiologist: Sandrea Hughs Performed by: anesthesiologist  Preanesthetic Checklist Completed: patient identified, site marked, surgical consent, pre-op evaluation, timeout performed, IV checked, risks and benefits discussed and monitors and equipment checked Spinal Block Patient position: sitting Prep: DuraPrep Patient monitoring: heart rate, cardiac monitor, continuous pulse ox and blood pressure Approach: midline Location: L3-4 Injection technique: single-shot Needle Needle type: Sprotte  Needle gauge: 24 G Needle length: 9 cm Needle insertion depth: 8 cm Assessment Sensory level: T4 Events: paresthesia Additional Notes R leg paresthesia X 1. Spinal injected when pt reported no longer feeling pareshesia.

## 2012-09-24 NOTE — Anesthesia Postprocedure Evaluation (Signed)
Anesthesia Post Note  Patient: Donna Conley  Procedure(s) Performed: Procedure(s) (LRB): CESAREAN SECTION (N/A)  Anesthesia type: Spinal  Patient location: PACU  Post pain: Pain level controlled  Post assessment: Post-op Vital signs reviewed  Last Vitals:  Filed Vitals:   09/24/12 1400  BP: 115/64  Pulse: 82  Temp:   Resp: 16    Post vital signs: Reviewed  Level of consciousness: awake  Complications: No apparent anesthesia complications

## 2012-09-24 NOTE — Anesthesia Preprocedure Evaluation (Addendum)
Anesthesia Evaluation  Patient identified by MRN, date of birth, ID band Patient awake    Reviewed: Allergy & Precautions, H&P , NPO status , Patient's Chart, lab work & pertinent test results  Airway Mallampati: III TM Distance: >3 FB Neck ROM: full    Dental No notable dental hx.    Pulmonary neg pulmonary ROS,  breath sounds clear to auscultation  Pulmonary exam normal       Cardiovascular negative cardio ROS      Neuro/Psych negative neurological ROS  negative psych ROS   GI/Hepatic negative GI ROS, Neg liver ROS,   Endo/Other  Morbid obesity  Renal/GU negative Renal ROS  negative genitourinary   Musculoskeletal negative musculoskeletal ROS (+)   Abdominal (+) + obese,   Peds negative pediatric ROS (+)  Hematology negative hematology ROS (+)   Anesthesia Other Findings   Reproductive/Obstetrics (+) Pregnancy                           Anesthesia Physical Anesthesia Plan  ASA: III  Anesthesia Plan: Spinal   Post-op Pain Management:    Induction:   Airway Management Planned:   Additional Equipment:   Intra-op Plan:   Post-operative Plan:   Informed Consent: I have reviewed the patients History and Physical, chart, labs and discussed the procedure including the risks, benefits and alternatives for the proposed anesthesia with the patient or authorized representative who has indicated his/her understanding and acceptance.     Plan Discussed with: CRNA and Surgeon  Anesthesia Plan Comments:        Anesthesia Quick Evaluation

## 2012-09-25 ENCOUNTER — Encounter (HOSPITAL_COMMUNITY): Payer: Self-pay | Admitting: Obstetrics

## 2012-09-25 LAB — CBC
MCH: 29.3 pg (ref 26.0–34.0)
MCHC: 33.6 g/dL (ref 30.0–36.0)
MCV: 87.3 fL (ref 78.0–100.0)
Platelets: 195 10*3/uL (ref 150–400)
RDW: 14.6 % (ref 11.5–15.5)

## 2012-09-25 NOTE — Progress Notes (Signed)
Subjective: Postpartum Day 1: Cesarean Delivery Patient reports incisional pain and tolerating PO.    Objective: Vital signs in last 24 hours: Temp:  [98.1 F (36.7 C)-99.3 F (37.4 C)] 98.1 F (36.7 C) (11/15 0413) Pulse Rate:  [77-106] 83  (11/15 0413) Resp:  [12-20] 16  (11/15 0413) BP: (95-122)/(55-85) 100/63 mmHg (11/15 0413) SpO2:  [95 %-100 %] 98 % (11/15 0413) Weight:  [102.967 kg (227 lb)] 102.967 kg (227 lb) (11/14 1037)  Physical Exam:  General: alert and no distress Lochia: appropriate Uterine Fundus: firm Incision: healing well DVT Evaluation: No evidence of DVT seen on physical exam.   Basename 09/25/12 0510 09/22/12 0843  HGB 9.5* 10.8*  HCT 28.3* 32.2*    Assessment/Plan: Status post Cesarean section. Doing well postoperatively.  Continue current care.  Lanell Carpenter A 09/25/2012, 6:03 AM

## 2012-09-25 NOTE — Progress Notes (Signed)
UR chart review completed.  

## 2012-09-26 LAB — TYPE AND SCREEN
ABO/RH(D): B POS
Unit division: 0

## 2012-09-26 MED ORDER — IBUPROFEN 600 MG PO TABS: 600.0000 mg | ORAL_TABLET | Freq: Four times a day (QID) | ORAL | Status: DC | PRN

## 2012-09-26 MED ORDER — OXYCODONE-ACETAMINOPHEN 5-325 MG PO TABS: 1.0000 | ORAL_TABLET | ORAL | Status: DC | PRN

## 2012-09-26 MED ORDER — FUSION PLUS PO CAPS: 1.0000 | ORAL_CAPSULE | Freq: Every day | ORAL | Status: DC

## 2012-09-26 NOTE — Progress Notes (Signed)
Subjective: Postpartum Day 2: Cesarean Delivery Patient reports incisional pain, tolerating PO, + flatus and no problems voiding.    Objective: Vital signs in last 24 hours: Temp:  [97.8 F (36.6 C)-98.5 F (36.9 C)] 98.2 F (36.8 C) (11/16 0518) Pulse Rate:  [76-86] 86  (11/16 0518) Resp:  [18] 18  (11/16 0518) BP: (98-121)/(64-78) 121/78 mmHg (11/16 0518) SpO2:  [98 %] 98 % (11/16 0518)  Physical Exam:  General: alert and no distress Lochia: appropriate Uterine Fundus: firm Incision: healing well DVT Evaluation: No evidence of DVT seen on physical exam.   Basename 09/25/12 0510  HGB 9.5*  HCT 28.3*    Assessment/Plan: Status post Cesarean section. Doing well postoperatively.  Continue current care.  HARPER,CHARLES A 09/26/2012, 8:47 AM

## 2012-09-26 NOTE — Discharge Summary (Signed)
Obstetric Discharge Summary Reason for Admission: cesarean section Prenatal Procedures: ultrasound Intrapartum Procedures: cesarean: low cervical, transverse Postpartum Procedures: none Complications-Operative and Postpartum: none Hemoglobin  Date Value Range Status  09/25/2012 9.5* 12.0 - 15.0 g/dL Final     HCT  Date Value Range Status  09/25/2012 28.3* 36.0 - 46.0 % Final    Physical Exam:  General: alert and no distress Lochia: appropriate Uterine Fundus: firm Incision: healing well DVT Evaluation: No evidence of DVT seen on physical exam.  Discharge Diagnoses: Term Pregnancy-delivered  Discharge Information: Date: 09/26/2012 Activity: pelvic rest Diet: routine Medications: PNV, Ibuprofen, Colace, Iron and Percocet Condition: stable Instructions: refer to practice specific booklet Discharge to: home Follow-up Information    Follow up with Donna Seckel A, MD. Schedule an appointment as soon as possible for Conley visit in 3 days. (Removal of staples.)    Contact information:   [address] [place] [address] [phone]          Newborn Data: Live born female  Birth Weight: 7 lb 7.6 oz (3390 g) APGAR: 7, 8  Home with mother.  Donna Conley 09/26/2012, 10:07 AM

## 2012-10-01 ENCOUNTER — Telehealth (HOSPITAL_COMMUNITY): Payer: Self-pay | Admitting: *Deleted

## 2012-10-01 NOTE — Telephone Encounter (Signed)
Resolve episode 

## 2012-11-13 ENCOUNTER — Encounter (HOSPITAL_COMMUNITY): Payer: Self-pay | Admitting: Pharmacist

## 2012-11-16 ENCOUNTER — Encounter (HOSPITAL_COMMUNITY): Payer: Self-pay

## 2012-11-16 ENCOUNTER — Encounter (HOSPITAL_COMMUNITY)
Admission: RE | Admit: 2012-11-16 | Discharge: 2012-11-16 | Disposition: A | Discharge status: Home / Self Care | Payer: Medicaid Other | Source: Ambulatory Visit | Attending: Obstetrics & Gynecology | Admitting: Obstetrics & Gynecology

## 2012-11-16 LAB — CBC
HCT: 31.3 % — ABNORMAL LOW (ref 36.0–46.0)
MCH: 28.9 pg (ref 26.0–34.0)
MCV: 85.3 fL (ref 78.0–100.0)
Platelets: 260 10*3/uL (ref 150–400)
RDW: 13.9 % (ref 11.5–15.5)

## 2012-11-16 NOTE — Patient Instructions (Addendum)
GENERAL PRE-OPERATIVE PATIENT INSTRUCTIONS   Your procedure is scheduled AV:[id number], January 10th  Enter through the Main Entrance of [facility] at:1:45pm Pick up the phone at the desk and dial [phone] and inform us of your arrival.  Please call this number if you have any problems the morning of surgery: [phone]  Remember: Do not eat anything after midnight on Thursday. You may have clear liquids until 11:00 am day of surgery then nothing.  Do not wear jewelry, make-up, or FINGER nail polish No metal in your hair or on your body. Do not wear lotions, powders, perfumes. You may wear deodorant. Please use your CHG wash as directed prior to surgery. Do not shave anywhere for at least 12 hours prior to first CHG shower. Do not bring valuables to the hospital.   For patients being admitted to the hospital, checkout time is 11:00am the day of discharge.

## 2012-11-20 ENCOUNTER — Encounter (HOSPITAL_COMMUNITY): Payer: Self-pay | Admitting: Anesthesiology

## 2012-11-20 ENCOUNTER — Ambulatory Visit (HOSPITAL_COMMUNITY): Payer: Medicaid Other | Admitting: Anesthesiology

## 2012-11-20 ENCOUNTER — Encounter (HOSPITAL_COMMUNITY): Payer: Self-pay | Admitting: Obstetrics & Gynecology

## 2012-11-20 ENCOUNTER — Ambulatory Visit (HOSPITAL_COMMUNITY)
Admission: RE | Admit: 2012-11-20 | Discharge: 2012-11-20 | Disposition: A | Discharge status: Home / Self Care | Payer: Medicaid Other | Source: Ambulatory Visit | Attending: Obstetrics & Gynecology | Admitting: Obstetrics & Gynecology

## 2012-11-20 ENCOUNTER — Encounter (HOSPITAL_COMMUNITY)
Admission: RE | Disposition: A | Discharge status: Home / Self Care | Payer: Self-pay | Source: Ambulatory Visit | Attending: Obstetrics & Gynecology

## 2012-11-20 DIAGNOSIS — Z302 Encounter for sterilization: Secondary | ICD-10-CM

## 2012-11-20 DIAGNOSIS — Z01818 Encounter for other preprocedural examination: Secondary | ICD-10-CM | POA: Insufficient documentation

## 2012-11-20 DIAGNOSIS — Z641 Problems related to multiparity: Secondary | ICD-10-CM | POA: Insufficient documentation

## 2012-11-20 DIAGNOSIS — Z01812 Encounter for preprocedural laboratory examination: Secondary | ICD-10-CM | POA: Insufficient documentation

## 2012-11-20 HISTORY — PX: LAPAROSCOPIC TUBAL LIGATION: SHX1937

## 2012-11-20 SURGERY — LIGATION, FALLOPIAN TUBE, LAPAROSCOPIC
Anesthesia: General | Site: Abdomen | Laterality: Bilateral | Wound class: Clean Contaminated

## 2012-11-20 MED ORDER — ONDANSETRON HCL 4 MG/2ML IJ SOLN: 4.0000 mg | Freq: Four times a day (QID) | INTRAMUSCULAR | Status: DC | PRN

## 2012-11-20 MED ORDER — OXYCODONE-ACETAMINOPHEN 5-325 MG PO TABS: 2.0000 | ORAL_TABLET | Freq: Four times a day (QID) | ORAL | Status: DC | PRN

## 2012-11-20 MED ORDER — SUCCINYLCHOLINE CHLORIDE 20 MG/ML IJ SOLN
INTRAMUSCULAR | Status: AC
  Filled 2012-11-20: qty 10

## 2012-11-20 MED ORDER — PROPOFOL 10 MG/ML IV EMUL
INTRAVENOUS | Status: AC
  Filled 2012-11-20: qty 20

## 2012-11-20 MED ORDER — OXYCODONE HCL 5 MG PO TABS: 5.0000 mg | ORAL_TABLET | ORAL | Status: DC | PRN

## 2012-11-20 MED ORDER — ONDANSETRON HCL 4 MG/2ML IJ SOLN
INTRAMUSCULAR | Status: AC
  Filled 2012-11-20: qty 2

## 2012-11-20 MED ORDER — MIDAZOLAM HCL 2 MG/2ML IJ SOLN: 0.5000 mg | Freq: Once | INTRAMUSCULAR | Status: DC | PRN

## 2012-11-20 MED ORDER — BUPIVACAINE HCL (PF) 0.25 % IJ SOLN
INTRAMUSCULAR | Status: AC
  Filled 2012-11-20: qty 30

## 2012-11-20 MED ORDER — PROMETHAZINE HCL 25 MG/ML IJ SOLN: 6.2500 mg | INTRAMUSCULAR | Status: DC | PRN

## 2012-11-20 MED ORDER — LIDOCAINE HCL (CARDIAC) 20 MG/ML IV SOLN
INTRAVENOUS | Status: DC | PRN
  Administered 2012-11-20: 80 mg via INTRAVENOUS

## 2012-11-20 MED ORDER — SODIUM CHLORIDE 0.9 % IJ SOLN: 3.0000 mL | INTRAMUSCULAR | Status: DC | PRN

## 2012-11-20 MED ORDER — PROPOFOL 10 MG/ML IV BOLUS
INTRAVENOUS | Status: DC | PRN
  Administered 2012-11-20: 200 mg via INTRAVENOUS

## 2012-11-20 MED ORDER — BUPIVACAINE HCL (PF) 0.25 % IJ SOLN
INTRAMUSCULAR | Status: DC | PRN
  Administered 2012-11-20: 6 mL

## 2012-11-20 MED ORDER — NEOSTIGMINE METHYLSULFATE 1 MG/ML IJ SOLN
INTRAMUSCULAR | Status: AC
  Filled 2012-11-20: qty 1

## 2012-11-20 MED ORDER — KETOROLAC TROMETHAMINE 30 MG/ML IJ SOLN
INTRAMUSCULAR | Status: AC
  Filled 2012-11-20: qty 1

## 2012-11-20 MED ORDER — LIDOCAINE HCL (CARDIAC) 20 MG/ML IV SOLN
INTRAVENOUS | Status: AC
  Filled 2012-11-20: qty 5

## 2012-11-20 MED ORDER — KETOROLAC TROMETHAMINE 30 MG/ML IJ SOLN: 15.0000 mg | Freq: Once | INTRAMUSCULAR | Status: DC | PRN

## 2012-11-20 MED ORDER — KETOROLAC TROMETHAMINE 30 MG/ML IJ SOLN
INTRAMUSCULAR | Status: DC | PRN
  Administered 2012-11-20: 30 mg via INTRAVENOUS

## 2012-11-20 MED ORDER — ROCURONIUM BROMIDE 50 MG/5ML IV SOLN
INTRAVENOUS | Status: AC
  Filled 2012-11-20: qty 1

## 2012-11-20 MED ORDER — ACETAMINOPHEN 325 MG PO TABS: 650.0000 mg | ORAL_TABLET | ORAL | Status: DC | PRN

## 2012-11-20 MED ORDER — LACTATED RINGERS IV SOLN
INTRAVENOUS | Status: DC
  Administered 2012-11-20: 50 mL/h via INTRAVENOUS
  Administered 2012-11-20: 17:00:00 via INTRAVENOUS

## 2012-11-20 MED ORDER — SODIUM CHLORIDE 0.9 % IV SOLN: 250.0000 mL | INTRAVENOUS | Status: DC | PRN

## 2012-11-20 MED ORDER — MEPERIDINE HCL 25 MG/ML IJ SOLN: 6.2500 mg | INTRAMUSCULAR | Status: DC | PRN

## 2012-11-20 MED ORDER — GLYCOPYRROLATE 0.2 MG/ML IJ SOLN
INTRAMUSCULAR | Status: AC
  Filled 2012-11-20: qty 2

## 2012-11-20 MED ORDER — MIDAZOLAM HCL 2 MG/2ML IJ SOLN
INTRAMUSCULAR | Status: AC
  Filled 2012-11-20: qty 2

## 2012-11-20 MED ORDER — FENTANYL CITRATE 0.05 MG/ML IJ SOLN: 25.0000 ug | INTRAMUSCULAR | Status: DC | PRN

## 2012-11-20 MED ORDER — SODIUM CHLORIDE 0.9 % IJ SOLN: 3.0000 mL | Freq: Two times a day (BID) | INTRAMUSCULAR | Status: DC

## 2012-11-20 MED ORDER — FENTANYL CITRATE 0.05 MG/ML IJ SOLN
INTRAMUSCULAR | Status: DC | PRN
  Administered 2012-11-20: 100 ug via INTRAVENOUS
  Administered 2012-11-20: 150 ug via INTRAVENOUS

## 2012-11-20 MED ORDER — DEXAMETHASONE SODIUM PHOSPHATE 10 MG/ML IJ SOLN
INTRAMUSCULAR | Status: AC
  Filled 2012-11-20: qty 1

## 2012-11-20 MED ORDER — ONDANSETRON HCL 4 MG/2ML IJ SOLN
INTRAMUSCULAR | Status: DC | PRN
  Administered 2012-11-20: 4 mg via INTRAVENOUS

## 2012-11-20 MED ORDER — ACETAMINOPHEN 650 MG RE SUPP
650.0000 mg | RECTAL | Status: DC | PRN
  Filled 2012-11-20: qty 1

## 2012-11-20 MED ORDER — SUCCINYLCHOLINE CHLORIDE 20 MG/ML IJ SOLN
INTRAMUSCULAR | Status: DC | PRN
  Administered 2012-11-20: 120 mg via INTRAVENOUS

## 2012-11-20 MED ORDER — MIDAZOLAM HCL 5 MG/5ML IJ SOLN
INTRAMUSCULAR | Status: DC | PRN
  Administered 2012-11-20: 2 mg via INTRAVENOUS

## 2012-11-20 MED ORDER — FENTANYL CITRATE 0.05 MG/ML IJ SOLN
INTRAMUSCULAR | Status: AC
  Filled 2012-11-20: qty 5

## 2012-11-20 MED ORDER — DEXAMETHASONE SODIUM PHOSPHATE 10 MG/ML IJ SOLN
INTRAMUSCULAR | Status: DC | PRN
  Administered 2012-11-20: 10 mg via INTRAVENOUS

## 2012-11-20 SURGICAL SUPPLY — 15 items
ADH SKN CLS APL DERMABOND .7 (GAUZE/BANDAGES/DRESSINGS) ×1
CATH ROBINSON RED A/P 16FR (CATHETERS) ×2 IMPLANT
CHLORAPREP W/TINT 26ML (MISCELLANEOUS) ×2 IMPLANT
CLOTH BEACON ORANGE TIMEOUT ST (SAFETY) ×2 IMPLANT
DERMABOND ADVANCED (GAUZE/BANDAGES/DRESSINGS) ×1
DERMABOND ADVANCED .7 DNX12 (GAUZE/BANDAGES/DRESSINGS) ×1 IMPLANT
GLOVE BIO SURGEON STRL SZ 6.5 (GLOVE) ×4 IMPLANT
GOWN PREVENTION PLUS LG XLONG (DISPOSABLE) ×4 IMPLANT
PACK LAPAROSCOPY BASIN (CUSTOM PROCEDURE TRAY) ×2 IMPLANT
SUT VIC AB 3-0 PS2 18 (SUTURE)
SUT VIC AB 3-0 PS2 18XBRD (SUTURE) IMPLANT
SUT VICRYL 0 UR6 27IN ABS (SUTURE) ×2 IMPLANT
TOWEL OR 17X24 6PK STRL BLUE (TOWEL DISPOSABLE) ×4 IMPLANT
TROCAR XCEL NON-BLD 11X100MML (ENDOMECHANICALS) ×2 IMPLANT
WATER STERILE IRR 1000ML POUR (IV SOLUTION) ×2 IMPLANT

## 2012-11-20 NOTE — Anesthesia Preprocedure Evaluation (Signed)
Anesthesia Evaluation  Patient identified by MRN, date of birth, ID band Patient awake    Reviewed: Allergy & Precautions, H&P , Patient's Chart, lab work & pertinent test results, reviewed documented beta blocker date and time   History of Anesthesia Complications Negative for: history of anesthetic complications  Airway Mallampati: III TM Distance: >3 FB Neck ROM: full    Dental No notable dental hx.    Pulmonary neg pulmonary ROS,  breath sounds clear to auscultation  Pulmonary exam normal       Cardiovascular Exercise Tolerance: Good negative cardio ROS  Rhythm:regular Rate:Normal     Neuro/Psych negative neurological ROS  negative psych ROS   GI/Hepatic negative GI ROS, Neg liver ROS,   Endo/Other  negative endocrine ROS  Renal/GU negative Renal ROS     Musculoskeletal   Abdominal   Peds  Hematology negative hematology ROS (+)   Anesthesia Other Findings   Reproductive/Obstetrics negative OB ROS                           Anesthesia Physical Anesthesia Plan  ASA: II  Anesthesia Plan: General ETT   Post-op Pain Management:    Induction:   Airway Management Planned:   Additional Equipment:   Intra-op Plan:   Post-operative Plan:   Informed Consent: I have reviewed the patients History and Physical, chart, labs and discussed the procedure including the risks, benefits and alternatives for the proposed anesthesia with the patient or authorized representative who has indicated his/her understanding and acceptance.   Dental Advisory Given  Plan Discussed with: CRNA and Surgeon  Anesthesia Plan Comments:         Anesthesia Quick Evaluation  

## 2012-11-20 NOTE — Transfer of Care (Signed)
Immediate Anesthesia Transfer of Care Note  Patient: Donna Conley  Procedure(s) Performed: Procedure(s) (LRB) with comments: LAPAROSCOPIC TUBAL LIGATION (Bilateral)  Patient Location: PACU  Anesthesia Type:General  Level of Consciousness: sedated  Airway & Oxygen Therapy: Patient Spontanous Breathing and Patient connected to nasal cannula oxygen  Post-op Assessment: Report given to PACU RN and Post -op Vital signs reviewed and stable  Post vital signs: stable  Complications: No apparent anesthesia complications

## 2012-11-20 NOTE — Op Note (Signed)
Procedure Note  Donna Conley 26 y.o. 11/20/2012  Preoperative Diagnosis:  Multiparity, desires a sterilization procedure  Postoperative Diagnosis: Same  Procedure: Laparoscopic bilateral tubal ligation with fulguration  Surgeon: Antionette Char A  Indications:  The patient now presents for a sterilization procedure after discussing therapeutic alternatives.        Procedure Detail:  The patient was taken to the operating room and was placed on the operating table in the dorsal supine position.  After his satisfactory general anesthesia was achieved, the patient was placed in the semi-lithotomy position using Allen stirrups. The patient was prepped and draped in the usual sterile manner for vaginal laparoscopic procedure. A speculum was placed in the vagina. The anterior lip of the cervix was grasped with a single-tooth tenaculum. A Hulka manipulator was then advanced into the uterus and secured  to the anterior lip of the cervix as a means to manipulate the uterus. The single-tooth tenaculum and Hulka manipulator were then removed. The infraumbilical region was then anesthetized with local anesthesia, 0.25%  Marcaine. A small incision was made to the skin and subcutaneous tissue. A 10 mm Optiview trocar was placed through the incision into the abdominal cavity diagnostic laparoscope with video camera attached was placed through the trocar sleeve and carbon dioxide was used to insufflate the abdominal and pelvic cavity. The pelvic contents were examined and the findings were described above. Kleppinger bipolar forceps were inserted through the operative port of the laparoscope. The left fallopian tube was identified and traced out to its fimbriated end. Then starting at the distal isthmus the proximal ampullary portion of the tube was coagulated in 4 contiguous areas. Each time the resistance meter went to 0 and the tube had been retracted away from an adjacent viscera. The right fallopian tube  was then manipulated in a similar fashion. The scope was removed and the excess carbon dioxide was remove through the port, before it was removed.  The subcutaneous layer of the incision was reapproximated with an interrupted figure-of eight 0-Vicryl suture on a UR 6 needle. A skin adhesive was applied.  The instruments removed from the vagina there is minimal bleeding from the cervix. Final sponge, instrument and needle counts were correct. The patient was awakened on the operating table and taken to the PACU in satisfactory condition.    Findings: The uterus was retroverted, retroflexed.  There was adhesive disease in the anterior cul-de-sac.  The ovaries and fallopian tubes were normal in appearance.  Estimated Blood Loss:  Minimal              Total IV Fluids: per Anesthesiology        Condition: stable

## 2012-11-20 NOTE — H&P (Signed)
  Chief Complaint: 27 y.o.  who presents for a laparoscopic BTL  Details of Present Illness: See above  Breastfeeding? No  Past Medical History  Diagnosis Date  . Urinary tract infection   . No pertinent past medical history    History   Social History  . Marital Status: Single    Spouse Name: N/A    Number of Children: N/A  . Years of Education: N/A   Occupational History  . Not on file.   Social History Main Topics  . Smoking status: Never Smoker   . Smokeless tobacco: Never Used  . Alcohol Use: No  . Drug Use: No  . Sexually Active: Yes    Birth Control/ Protection: None   Other Topics Concern  . Not on file   Social History Narrative  . No narrative on file   History reviewed. No pertinent family history.  Pertinent items are noted in HPI.  Pre-Op Diagnosis: desires sterilization   Planned Procedure: Procedure(s): LAPAROSCOPIC TUBAL LIGATION  I have reviewed the patient's history and have completed the physical exam and Donna Conley is acceptable for surgery.  Roseanna Rainbow, MD 11/20/2012 12:16 PM

## 2012-11-20 NOTE — Anesthesia Postprocedure Evaluation (Signed)
Anesthesia Post Note  Patient: Donna Conley  Procedure(s) Performed: Procedure(s) (LRB): LAPAROSCOPIC TUBAL LIGATION (Bilateral)  Anesthesia type: GA  Patient location: PACU  Post pain: Pain level controlled  Post assessment: Post-op Vital signs reviewed  Last Vitals:  Filed Vitals:   11/20/12 1645  BP: 117/71  Pulse: 87  Temp:   Resp: 16    Post vital signs: Reviewed  Level of consciousness: sedated  Complications: No apparent anesthesia complications

## 2012-11-23 ENCOUNTER — Encounter (HOSPITAL_COMMUNITY): Payer: Self-pay | Admitting: Obstetrics & Gynecology

## 2013-04-15 ENCOUNTER — Ambulatory Visit: Payer: Self-pay | Admitting: Obstetrics

## 2013-05-20 ENCOUNTER — Ambulatory Visit: Payer: Medicaid Other | Admitting: Obstetrics

## 2013-06-16 ENCOUNTER — Encounter: Payer: Self-pay | Admitting: Obstetrics

## 2013-09-13 ENCOUNTER — Emergency Department (HOSPITAL_BASED_OUTPATIENT_CLINIC_OR_DEPARTMENT_OTHER)
Admission: EM | Admit: 2013-09-13 | Discharge: 2013-09-13 | Disposition: A | Discharge status: Home / Self Care | Payer: BC Managed Care – PPO | Attending: Emergency Medicine | Admitting: Emergency Medicine

## 2013-09-13 ENCOUNTER — Encounter (HOSPITAL_BASED_OUTPATIENT_CLINIC_OR_DEPARTMENT_OTHER): Payer: Self-pay | Admitting: Emergency Medicine

## 2013-09-13 DIAGNOSIS — Z87440 Personal history of urinary (tract) infections: Secondary | ICD-10-CM | POA: Insufficient documentation

## 2013-09-13 DIAGNOSIS — R05 Cough: Secondary | ICD-10-CM | POA: Insufficient documentation

## 2013-09-13 DIAGNOSIS — H579 Unspecified disorder of eye and adnexa: Secondary | ICD-10-CM | POA: Insufficient documentation

## 2013-09-13 DIAGNOSIS — H109 Unspecified conjunctivitis: Secondary | ICD-10-CM

## 2013-09-13 DIAGNOSIS — R059 Cough, unspecified: Secondary | ICD-10-CM | POA: Insufficient documentation

## 2013-09-13 MED ORDER — POLYMYXIN B-TRIMETHOPRIM 10000-0.1 UNIT/ML-% OP SOLN: 1.0000 [drp] | OPHTHALMIC | Status: DC

## 2013-09-13 MED ORDER — TETRACAINE HCL 0.5 % OP SOLN
2.0000 [drp] | Freq: Once | OPHTHALMIC | Status: DC
  Filled 2013-09-13: qty 2

## 2013-09-13 MED ORDER — FLUORESCEIN SODIUM 1 MG OP STRP
1.0000 | ORAL_STRIP | Freq: Once | OPHTHALMIC | Status: DC
  Filled 2013-09-13: qty 1

## 2013-09-13 NOTE — ED Notes (Signed)
Rt drainage onsset yesterday am,  Itching  Denies inj

## 2013-09-13 NOTE — ED Provider Notes (Signed)
CSN: [id number]     Arrival date & time 09/13/13  7253 History   First MD Initiated Contact with Patient 09/13/13 0854     No chief complaint on file.  (Consider location/radiation/quality/duration/timing/severity/associated sxs/prior Treatment) HPI Comments: Patient presents with history of right eye itching and irritation. She denies any trauma. She woke up yesterday morning with crusting and drainage of purulent material from her right eye. Denies any visual change. Denies any fevers, vomiting, sore throat or runny nose. Denies any also similar symptoms. She does not wear glasses or contacts. No blurry vision or double vision. Denies any eye pain.  The history is provided by the patient.    Past Medical History  Diagnosis Date  . Urinary tract infection   . No pertinent past medical history    Past Surgical History  Procedure Laterality Date  . Cesarean section    . Cesarean section  2009    General Anesthesia  . Cesarean section  09/24/2012    Procedure: CESAREAN SECTION;  Surgeon: Brock Bad, MD;  Location: [facility];  Service: Obstetrics;  Laterality: N/A;  . Laparoscopic tubal ligation  11/20/2012    Procedure: LAPAROSCOPIC TUBAL LIGATION;  Surgeon: Antionette Char, MD;  Location: [facility];  Service: Gynecology;  Laterality: Bilateral;   History reviewed. No pertinent family history. History  Substance Use Topics  . Smoking status: Never Smoker   . Smokeless tobacco: Never Used  . Alcohol Use: No   OB History   Grav Para Term Preterm Abortions TAB SAB Ect Mult Living   2 2 1       1      Review of Systems  Constitutional: Negative for activity change and appetite change.  Eyes: Positive for discharge, redness and itching. Negative for photophobia, pain and visual disturbance.  Respiratory: Positive for cough. Negative for chest tightness and shortness of breath.   Cardiovascular: Negative for chest pain.  Gastrointestinal: Negative for nausea, vomiting and  abdominal pain.  Genitourinary: Negative for dysuria and hematuria.  Skin: Negative for rash.  A complete 10 system review of systems was obtained and all systems are negative except as noted in the HPI and PMH.    Allergies  Review of patient's allergies indicates no known allergies.  Home Medications   Current Outpatient Rx  Name  Route  Sig  Dispense  Refill  . oxyCODONE-acetaminophen (PERCOCET) 5-325 MG per tablet   Oral   Take 2 tablets by mouth every 6 (six) hours as needed for pain.   30 tablet   0   . trimethoprim-polymyxin b (POLYTRIM) ophthalmic solution   Right Eye   Place 1 drop into the right eye every 4 (four) hours.   10 mL   0   . valACYclovir (VALTREX) 500 MG tablet   Oral   Take 500 mg by mouth daily as needed. For outbreaks          BP 114/75  Pulse 84  Temp(Src) 98.3 F (36.8 C) (Oral)  Resp 16  Ht 5\' 3"  (1.6 m)  Wt 185 lb (83.915 kg)  BMI 32.78 kg/m2  SpO2 100% Physical Exam  Constitutional: She is oriented to person, place, and time. She appears well-developed and well-nourished. No distress.  HENT:  Head: Normocephalic and atraumatic.  Right Ear: External ear normal.  Left Ear: External ear normal.  Mouth/Throat: Oropharynx is clear and moist. No oropharyngeal exudate.  Eyes: EOM are normal. Pupils are equal, round, and reactive to light. Lids are everted  and swept, no foreign bodies found. Right eye exhibits discharge. Right eye exhibits no hordeolum. No foreign body present in the right eye. Right conjunctiva is injected. Right conjunctiva has no hemorrhage.  Slit lamp exam:      The right eye shows no corneal abrasion, no corneal ulcer, no foreign body, no hyphema, no hypopyon, no fluorescein uptake and no anterior chamber bulge.  No dendrites  Neck: Normal range of motion. Neck supple.  No meningismus  Cardiovascular: Normal rate, regular rhythm and normal heart sounds.   No murmur heard. Pulmonary/Chest: Effort normal and breath  sounds normal. No respiratory distress.  Abdominal: Soft. There is no tenderness. There is no rebound and no guarding.  Musculoskeletal: Normal range of motion.  Neurological: She is alert and oriented to person, place, and time. No cranial nerve deficit. She exhibits normal muscle tone. Coordination normal.  Skin: Skin is warm.    ED Course  Procedures (including critical care time) Labs Review Labs Reviewed - No data to display Imaging Review No results found.  EKG Interpretation   None       MDM   1. Conjunctivitis    Two-day history of right eye irritation. No visual change. No fevers or vomiting. Exam consistent with conjunctivitis.  Visual acuity 20/25.  Exam shows no fluorescein uptake or evidence of abrasion. No dendrites. No hypopyon or hyphema.    We'll treat for conjunctivitis with Polytrim drops, followup with ophthalmology.    Glynn Octave, MD 09/13/13 [phone]

## 2013-09-13 NOTE — ED Notes (Signed)
Pt c/o rt eye drainage onset yesterday am,  Itching  Denies inj

## 2013-12-18 ENCOUNTER — Encounter (HOSPITAL_BASED_OUTPATIENT_CLINIC_OR_DEPARTMENT_OTHER): Payer: Self-pay | Admitting: Emergency Medicine

## 2013-12-18 ENCOUNTER — Emergency Department (HOSPITAL_BASED_OUTPATIENT_CLINIC_OR_DEPARTMENT_OTHER)
Admission: EM | Admit: 2013-12-18 | Discharge: 2013-12-18 | Disposition: A | Discharge status: Home / Self Care | Payer: BC Managed Care – PPO | Attending: Emergency Medicine | Admitting: Emergency Medicine

## 2013-12-18 DIAGNOSIS — Z87440 Personal history of urinary (tract) infections: Secondary | ICD-10-CM | POA: Insufficient documentation

## 2013-12-18 DIAGNOSIS — K529 Noninfective gastroenteritis and colitis, unspecified: Secondary | ICD-10-CM

## 2013-12-18 DIAGNOSIS — K5289 Other specified noninfective gastroenteritis and colitis: Secondary | ICD-10-CM | POA: Insufficient documentation

## 2013-12-18 LAB — CBC WITH DIFFERENTIAL/PLATELET
Basophils Absolute: 0 10*3/uL (ref 0.0–0.1)
Basophils Relative: 0 % (ref 0–1)
EOS PCT: 1 % (ref 0–5)
Eosinophils Absolute: 0 10*3/uL (ref 0.0–0.7)
HEMATOCRIT: 32.5 % — AB (ref 36.0–46.0)
HEMOGLOBIN: 11.1 g/dL — AB (ref 12.0–15.0)
LYMPHS ABS: 0.2 10*3/uL — AB (ref 0.7–4.0)
LYMPHS PCT: 6 % — AB (ref 12–46)
MCH: 29.4 pg (ref 26.0–34.0)
MCHC: 34.2 g/dL (ref 30.0–36.0)
MCV: 86 fL (ref 78.0–100.0)
MONO ABS: 0.4 10*3/uL (ref 0.1–1.0)
MONOS PCT: 11 % (ref 3–12)
Neutro Abs: 2.9 10*3/uL (ref 1.7–7.7)
Neutrophils Relative %: 82 % — ABNORMAL HIGH (ref 43–77)
PLATELETS: 257 10*3/uL (ref 150–400)
RBC: 3.78 MIL/uL — AB (ref 3.87–5.11)
RDW: 12.6 % (ref 11.5–15.5)
WBC: 3.5 10*3/uL — AB (ref 4.0–10.5)

## 2013-12-18 LAB — COMPREHENSIVE METABOLIC PANEL
ALT: 14 U/L (ref 0–35)
AST: 18 U/L (ref 0–37)
Albumin: 4 g/dL (ref 3.5–5.2)
Alkaline Phosphatase: 49 U/L (ref 39–117)
BUN: 8 mg/dL (ref 6–23)
CALCIUM: 9.4 mg/dL (ref 8.4–10.5)
CO2: 23 meq/L (ref 19–32)
Chloride: 104 mEq/L (ref 96–112)
Creatinine, Ser: 0.8 mg/dL (ref 0.50–1.10)
GLUCOSE: 99 mg/dL (ref 70–99)
Potassium: 3.6 mEq/L — ABNORMAL LOW (ref 3.7–5.3)
SODIUM: 139 meq/L (ref 137–147)
Total Bilirubin: 0.5 mg/dL (ref 0.3–1.2)
Total Protein: 7.8 g/dL (ref 6.0–8.3)

## 2013-12-18 MED ORDER — KETOROLAC TROMETHAMINE 30 MG/ML IJ SOLN
30.0000 mg | Freq: Once | INTRAMUSCULAR | Status: AC
  Administered 2013-12-18: 30 mg via INTRAVENOUS
  Filled 2013-12-18: qty 1

## 2013-12-18 MED ORDER — SODIUM CHLORIDE 0.9 % IV BOLUS (SEPSIS)
1000.0000 mL | Freq: Once | INTRAVENOUS | Status: AC
  Administered 2013-12-18: 1000 mL via INTRAVENOUS

## 2013-12-18 MED ORDER — ONDANSETRON HCL 4 MG/2ML IJ SOLN
4.0000 mg | Freq: Once | INTRAMUSCULAR | Status: AC
  Administered 2013-12-18: 4 mg via INTRAVENOUS
  Filled 2013-12-18: qty 2

## 2013-12-18 MED ORDER — ONDANSETRON 8 MG PO TBDP: ORAL_TABLET | ORAL | Status: DC

## 2013-12-18 NOTE — ED Notes (Signed)
patient c/o n/v/d for the past two days

## 2013-12-18 NOTE — ED Provider Notes (Signed)
CSN: [id number]     Arrival date & time 12/18/13  0710 History   First MD Initiated Contact with Patient 12/18/13 0719     Chief Complaint  Patient presents with  . Emesis   (Consider location/radiation/quality/duration/timing/severity/associated sxs/prior Treatment) HPI Comments: Patient is a 28 year old female who presents with complaints of nausea, vomiting, and diarrhea for the past 2 days. All of been nonbloody and nonbilious. She denies any fevers. Her son was ill a similar fashion and she believes she may have caught a virus from him. She denies any significant abdominal pain. There are no urinary complaints. She is status post tubal ligation one year ago.  Patient is a 28 y.o. female presenting with vomiting. The history is provided by the patient.  Emesis Severity:  Moderate Duration:  2 days Timing:  Constant Progression:  Worsening Chronicity:  New Recent urination:  Decreased Relieved by:  Nothing Worsened by:  Nothing tried Ineffective treatments:  None tried Associated symptoms: headaches   Associated symptoms: no abdominal pain, no chills, no fever and no myalgias     Past Medical History  Diagnosis Date  . Urinary tract infection   . No pertinent past medical history    Past Surgical History  Procedure Laterality Date  . Cesarean section    . Cesarean section  2009    General Anesthesia  . Cesarean section  09/24/2012    Procedure: CESAREAN SECTION;  Surgeon: Shelly Bombard, MD;  Location: [facility];  Service: Obstetrics;  Laterality: N/A;  . Laparoscopic tubal ligation  11/20/2012    Procedure: LAPAROSCOPIC TUBAL LIGATION;  Surgeon: Lahoma Crocker, MD;  Location: [facility];  Service: Gynecology;  Laterality: Bilateral;   No family history on file. History  Substance Use Topics  . Smoking status: Never Smoker   . Smokeless tobacco: Never Used  . Alcohol Use: No   OB History   Grav Para Term Preterm Abortions TAB SAB Ect Mult Living   2 2 1       1       Review of Systems  Constitutional: Negative for chills.  Gastrointestinal: Positive for vomiting. Negative for abdominal pain.  Musculoskeletal: Negative for myalgias.  Neurological: Positive for headaches.  All other systems reviewed and are negative.    Allergies  Review of patient's allergies indicates no known allergies.  Home Medications   Current Outpatient Rx  Name  Route  Sig  Dispense  Refill  . oxyCODONE-acetaminophen (PERCOCET) 5-325 MG per tablet   Oral   Take 2 tablets by mouth every 6 (six) hours as needed for pain.   30 tablet   0   . trimethoprim-polymyxin b (POLYTRIM) ophthalmic solution   Right Eye   Place 1 drop into the right eye every 4 (four) hours.   10 mL   0   . valACYclovir (VALTREX) 500 MG tablet   Oral   Take 500 mg by mouth daily as needed. For outbreaks          BP 119/73  Pulse 116  Temp(Src) 98.9 F (37.2 C) (Oral)  Resp 20  SpO2 99% Physical Exam  Nursing note and vitals reviewed. Constitutional: She is oriented to person, place, and time. She appears well-developed and well-nourished. No distress.  HENT:  Head: Normocephalic and atraumatic.  Mouth/Throat: Oropharynx is clear and moist.  Neck: Normal range of motion. Neck supple.  Cardiovascular: Normal rate and regular rhythm.  Exam reveals no gallop and no friction rub.   No murmur heard.  Pulmonary/Chest: Effort normal and breath sounds normal. No respiratory distress. She has no wheezes.  Abdominal: Soft. Bowel sounds are normal. She exhibits no distension and no mass. There is no tenderness. There is no rebound and no guarding.  Musculoskeletal: Normal range of motion.  Neurological: She is alert and oriented to person, place, and time.  Skin: Skin is warm and dry. She is not diaphoretic.    ED Course  Procedures (including critical care time) Labs Review Labs Reviewed - No data to display Imaging Review No results found.    MDM  No diagnosis  found. Presentation, exam, and laboratory findings consistent with a viral gastroenteritis. She is feeling better with IV fluids, and time fracture, and anti-inflammatories. Will discharge to home with Zofran and when necessary return.    Veryl Speak, MD 12/18/13 [phone]

## 2013-12-18 NOTE — Discharge Instructions (Signed)
Zofran as needed for nausea.  Return to the emergency department if you develop severe abdominal pain, bloody stools, no urine output in 12 hours, or other new or concerning symptoms.   Viral Gastroenteritis Viral gastroenteritis is also known as stomach flu. This condition affects the stomach and intestinal tract. It can cause sudden diarrhea and vomiting. The illness typically lasts 3 to 8 days. Most people develop an immune response that eventually gets rid of the virus. While this natural response develops, the virus can make you quite ill. CAUSES  Many different viruses can cause gastroenteritis, such as rotavirus or noroviruses. You can catch one of these viruses by consuming contaminated food or water. You may also catch a virus by sharing utensils or other personal items with an infected person or by touching a contaminated surface. SYMPTOMS  The most common symptoms are diarrhea and vomiting. These problems can cause a severe loss of body fluids (dehydration) and a body salt (electrolyte) imbalance. Other symptoms may include:  Fever.  Headache.  Fatigue.  Abdominal pain. DIAGNOSIS  Your caregiver can usually diagnose viral gastroenteritis based on your symptoms and a physical exam. A stool sample may also be taken to test for the presence of viruses or other infections. TREATMENT  This illness typically goes away on its own. Treatments are aimed at rehydration. The most serious cases of viral gastroenteritis involve vomiting so severely that you are not able to keep fluids down. In these cases, fluids must be given through an intravenous line (IV). HOME CARE INSTRUCTIONS   Drink enough fluids to keep your urine clear or pale yellow. Drink small amounts of fluids frequently and increase the amounts as tolerated.  Ask your caregiver for specific rehydration instructions.  Avoid:  Foods high in sugar.  Alcohol.  Carbonated drinks.  Tobacco.  Juice.  Caffeine  drinks.  Extremely hot or cold fluids.  Fatty, greasy foods.  Too much intake of anything at one time.  Dairy products until 24 to 48 hours after diarrhea stops.  You may consume probiotics. Probiotics are active cultures of beneficial bacteria. They may lessen the amount and number of diarrheal stools in adults. Probiotics can be found in yogurt with active cultures and in supplements.  Wash your hands well to avoid spreading the virus.  Only take over-the-counter or prescription medicines for pain, discomfort, or fever as directed by your caregiver. Do not give aspirin to children. Antidiarrheal medicines are not recommended.  Ask your caregiver if you should continue to take your regular prescribed and over-the-counter medicines.  Keep all follow-up appointments as directed by your caregiver. SEEK IMMEDIATE MEDICAL CARE IF:   You are unable to keep fluids down.  You do not urinate at least once every 6 to 8 hours.  You develop shortness of breath.  You notice blood in your stool or vomit. This may look like coffee grounds.  You have abdominal pain that increases or is concentrated in one small area (localized).  You have persistent vomiting or diarrhea.  You have a fever.  The patient is a child younger than 3 months, and he or she has a fever.  The patient is a child older than 3 months, and he or she has a fever and persistent symptoms.  The patient is a child older than 3 months, and he or she has a fever and symptoms suddenly get worse.  The patient is a baby, and he or she has no tears when crying. MAKE SURE YOU:  Understand these instructions.  Will watch your condition.  Will get help right away if you are not doing well or get worse. Document Released: 10/28/2005 Document Revised: 01/20/2012 Document Reviewed: 08/14/2011 [facility] Patient Information 2014 [place].

## 2013-12-28 ENCOUNTER — Other Ambulatory Visit: Payer: Self-pay | Admitting: Obstetrics

## 2014-07-05 ENCOUNTER — Other Ambulatory Visit: Payer: Self-pay | Admitting: *Deleted

## 2014-07-05 MED ORDER — VALACYCLOVIR HCL 500 MG PO TABS: ORAL_TABLET | ORAL | Status: DC

## 2014-07-05 NOTE — Progress Notes (Signed)
Fax received from pt pharmacy for refill of Valacyclovir 500mg .  Per Dr Jodi Mourning approval refill was sent.

## 2014-09-12 ENCOUNTER — Encounter (HOSPITAL_BASED_OUTPATIENT_CLINIC_OR_DEPARTMENT_OTHER): Payer: Self-pay | Admitting: Emergency Medicine

## 2014-11-27 ENCOUNTER — Emergency Department (HOSPITAL_BASED_OUTPATIENT_CLINIC_OR_DEPARTMENT_OTHER)
Admission: EM | Admit: 2014-11-27 | Discharge: 2014-11-27 | Disposition: A | Discharge status: Home / Self Care | Payer: Medicaid Other | Attending: Emergency Medicine | Admitting: Emergency Medicine

## 2014-11-27 ENCOUNTER — Encounter (HOSPITAL_BASED_OUTPATIENT_CLINIC_OR_DEPARTMENT_OTHER): Payer: Self-pay | Admitting: Emergency Medicine

## 2014-11-27 DIAGNOSIS — Z3202 Encounter for pregnancy test, result negative: Secondary | ICD-10-CM | POA: Insufficient documentation

## 2014-11-27 DIAGNOSIS — B9689 Other specified bacterial agents as the cause of diseases classified elsewhere: Secondary | ICD-10-CM

## 2014-11-27 DIAGNOSIS — B3731 Acute candidiasis of vulva and vagina: Secondary | ICD-10-CM

## 2014-11-27 DIAGNOSIS — N760 Acute vaginitis: Secondary | ICD-10-CM | POA: Insufficient documentation

## 2014-11-27 DIAGNOSIS — B373 Candidiasis of vulva and vagina: Secondary | ICD-10-CM | POA: Insufficient documentation

## 2014-11-27 DIAGNOSIS — Z87440 Personal history of urinary (tract) infections: Secondary | ICD-10-CM | POA: Insufficient documentation

## 2014-11-27 LAB — WET PREP, GENITAL: Trich, Wet Prep: NONE SEEN

## 2014-11-27 LAB — PREGNANCY, URINE: Preg Test, Ur: NEGATIVE

## 2014-11-27 MED ORDER — METRONIDAZOLE 500 MG PO TABS: 500.0000 mg | ORAL_TABLET | Freq: Two times a day (BID) | ORAL | Status: DC

## 2014-11-27 MED ORDER — FLUCONAZOLE 150 MG PO TABS: 150.0000 mg | ORAL_TABLET | Freq: Once | ORAL | Status: DC

## 2014-11-27 NOTE — ED Provider Notes (Signed)
CSN: [id number]     Arrival date & time 11/27/14  1243 History   First MD Initiated Contact with Patient 11/27/14 1244     Chief Complaint  Patient presents with  . Vaginitis     (Consider location/radiation/quality/duration/timing/severity/associated sxs/prior Treatment) HPI Comments: 29 year old female presenting with concerns of a vaginal infection. Patient reports over the past few days she's noticed an odor when she's having intercourse. Denies vaginal discharge. Denies increased urinary frequency, urgency or dysuria. No abdominal pain, fevers, nausea or vomiting. She has been monogamous with her female partner for a long time and is not concerned about any sexually transmitted diseases. No dyspareunia. She is not on birth control and does not use protection.  The history is provided by the patient.    Past Medical History  Diagnosis Date  . Urinary tract infection   . No pertinent past medical history    Past Surgical History  Procedure Laterality Date  . Cesarean section    . Cesarean section  2009    General Anesthesia  . Cesarean section  09/24/2012    Procedure: CESAREAN SECTION;  Surgeon: Shelly Bombard, MD;  Location: [facility];  Service: Obstetrics;  Laterality: N/A;  . Laparoscopic tubal ligation  11/20/2012    Procedure: LAPAROSCOPIC TUBAL LIGATION;  Surgeon: Lahoma Crocker, MD;  Location: [facility];  Service: Gynecology;  Laterality: Bilateral;   No family history on file. History  Substance Use Topics  . Smoking status: Never Smoker   . Smokeless tobacco: Never Used  . Alcohol Use: No   OB History    Gravida Para Term Preterm AB TAB SAB Ectopic Multiple Living   2 2 1       1      Review of Systems  Genitourinary:       + Vaginal odor with intercourse.  All other systems reviewed and are negative.     Allergies  Review of patient's allergies indicates no known allergies.  Home Medications   Prior to Admission medications   Medication Sig Start Date  End Date Taking? Authorizing Provider  fluconazole (DIFLUCAN) 150 MG tablet Take 1 tablet (150 mg total) by mouth once. Take 1 tablet 48 hours after the first. 11/27/14   Carman Ching, PA-C  metroNIDAZOLE (FLAGYL) 500 MG tablet Take 1 tablet (500 mg total) by mouth 2 (two) times daily. One po bid x 7 days 11/27/14   Carman Ching, PA-C  ondansetron (ZOFRAN ODT) 8 MG disintegrating tablet 8mg  ODT q4 hours prn nausea 12/18/13   Veryl Speak, MD  oxyCODONE-acetaminophen (PERCOCET) 5-325 MG per tablet Take 2 tablets by mouth every 6 (six) hours as needed for pain. 11/20/12   Lahoma Crocker, MD  trimethoprim-polymyxin b (POLYTRIM) ophthalmic solution Place 1 drop into the right eye every 4 (four) hours. 09/13/13   Ezequiel Essex, MD  valACYclovir (VALTREX) 500 MG tablet TAKE 1 TABLET BY MOUTH TWICE A DAY FOR 5 DAYS THEN TAKE 1 TABLET EVERY DAY THEREAFTER 07/05/14   Shelly Bombard, MD   BP 109/73 mmHg  Pulse 90  Temp(Src) 98.6 F (37 C) (Oral)  Resp 18  Ht 5\' 4"  (1.626 m)  Wt 183 lb (83.008 kg)  BMI 31.40 kg/m2  SpO2 100%  LMP 11/16/2014 Physical Exam  Constitutional: She is oriented to person, place, and time. She appears well-developed and well-nourished. No distress.  HENT:  Head: Normocephalic and atraumatic.  Mouth/Throat: Oropharynx is clear and moist.  Eyes: Conjunctivae are normal.  Neck: Normal  range of motion. Neck supple.  Cardiovascular: Normal rate, regular rhythm and normal heart sounds.   Pulmonary/Chest: Effort normal and breath sounds normal.  Abdominal: Soft. Bowel sounds are normal. There is no tenderness.  Genitourinary: Uterus normal. Uterus is not tender. Cervix exhibits no motion tenderness, no discharge and no friability. Right adnexum displays no mass and no tenderness. Left adnexum displays no mass and no tenderness. No erythema, tenderness or bleeding in the vagina. Vaginal discharge (white, malodorous) found.  Musculoskeletal: Normal range of motion. She exhibits no  edema.  Neurological: She is alert and oriented to person, place, and time.  Skin: Skin is warm and dry. She is not diaphoretic.  Psychiatric: She has a normal mood and affect. Her behavior is normal.  Nursing note and vitals reviewed.   ED Course  Procedures (including critical care time) Labs Review Labs Reviewed  WET PREP, GENITAL - Abnormal; Notable for the following:    Yeast Wet Prep HPF POC MODERATE (*)    Clue Cells Wet Prep HPF POC TOO NUMEROUS TO COUNT (*)    WBC, Wet Prep HPF POC TOO NUMEROUS TO COUNT (*)    All other components within normal limits  PREGNANCY, URINE  GC/CHLAMYDIA PROBE AMP ([place])    Imaging Review No results found.   EKG Interpretation None      MDM   Final diagnoses:  BV (bacterial vaginosis)  Yeast vaginitis   Pt in NAD. AFVSS. No associated abdominal pain, pelvic pain or fever. Wet prep consistent with BV and yeast vaginitis. Will treat with flagyl and diflucan. Infection care/precautions discussed. Doubt PID, no CMT or adnexal tenderness. Gc/chalmydia cultures pending. Stable for d/c. Return precautions given. Patient states understanding of treatment care plan and is agreeable.  Carman Ching, PA-C 11/27/14 1331  Dorie Rank, MD 11/27/14 1434

## 2014-11-27 NOTE — ED Notes (Signed)
Pt presents to ED with complaints of vaginal smell and thinks she has a bacterial infection.

## 2014-11-27 NOTE — Discharge Instructions (Signed)
Take flagyl twice daily for 1 week. Take diflucan as directed, one tablet followed by a second 48 hours later.  Bacterial Vaginosis Bacterial vaginosis is a vaginal infection that occurs when the normal balance of bacteria in the vagina is disrupted. It results from an overgrowth of certain bacteria. This is the most common vaginal infection in women of childbearing age. Treatment is important to prevent complications, especially in pregnant women, as it can cause a premature delivery. CAUSES  Bacterial vaginosis is caused by an increase in harmful bacteria that are normally present in smaller amounts in the vagina. Several different kinds of bacteria can cause bacterial vaginosis. However, the reason that the condition develops is not fully understood. RISK FACTORS Certain activities or behaviors can put you at an increased risk of developing bacterial vaginosis, including:  Having a new sex partner or multiple sex partners.  Douching.  Using an intrauterine device (IUD) for contraception. Women do not get bacterial vaginosis from toilet seats, bedding, swimming pools, or contact with objects around them. SIGNS AND SYMPTOMS  Some women with bacterial vaginosis have no signs or symptoms. Common symptoms include:  Grey vaginal discharge.  A fishlike odor with discharge, especially after sexual intercourse.  Itching or burning of the vagina and vulva.  Burning or pain with urination. DIAGNOSIS  Your health care provider will take a medical history and examine the vagina for signs of bacterial vaginosis. A sample of vaginal fluid may be taken. Your health care provider will look at this sample under a microscope to check for bacteria and abnormal cells. A vaginal pH test may also be done.  TREATMENT  Bacterial vaginosis may be treated with antibiotic medicines. These may be given in the form of a pill or a vaginal cream. A second round of antibiotics may be prescribed if the condition comes  back after treatment.  HOME CARE INSTRUCTIONS   Only take over-the-counter or prescription medicines as directed by your health care provider.  If antibiotic medicine was prescribed, take it as directed. Make sure you finish it even if you start to feel better.  Do not have sex until treatment is completed.  Tell all sexual partners that you have a vaginal infection. They should see their health care provider and be treated if they have problems, such as a mild rash or itching.  Practice safe sex by using condoms and only having one sex partner. SEEK MEDICAL CARE IF:   Your symptoms are not improving after 3 days of treatment.  You have increased discharge or pain.  You have a fever. MAKE SURE YOU:   Understand these instructions.  Will watch your condition.  Will get help right away if you are not doing well or get worse. FOR MORE INFORMATION  Centers for Disease Control and Prevention, Division of STD Prevention: [employer] American Sexual Health Association (ASHA): www.ashastd.org  Document Released: 10/28/2005 Document Revised: 08/18/2013 Document Reviewed: 06/09/2013 [facility] Patient Information 2015 [place], [place]. This information is not intended to replace advice given to you by your health care provider. Make sure you discuss any questions you have with your health care provider.  Vaginitis Vaginitis is an inflammation of the vagina. It is most often caused by a change in the normal balance of the bacteria and yeast that live in the vagina. This change in balance causes an overgrowth of certain bacteria or yeast, which causes the inflammation. There are different types of vaginitis, but the most common types are:  Bacterial vaginosis.  Yeast  infection (candidiasis).  Trichomoniasis vaginitis. This is a sexually transmitted infection (STI).  Viral vaginitis.  Atropic vaginitis.  Allergic vaginitis. CAUSES  The cause depends on the type of vaginitis.  Vaginitis can be caused by:  Bacteria (bacterial vaginosis).  Yeast (yeast infection).  A parasite (trichomoniasis vaginitis)  A virus (viral vaginitis).  Low hormone levels (atrophic vaginitis). Low hormone levels can occur during pregnancy, breastfeeding, or after menopause.  Irritants, such as bubble baths, scented tampons, and feminine sprays (allergic vaginitis). Other factors can change the normal balance of the yeast and bacteria that live in the vagina. These include:  Antibiotic medicines.  Poor hygiene.  Diaphragms, vaginal sponges, spermicides, birth control pills, and intrauterine devices (IUD).  Sexual intercourse.  Infection.  Uncontrolled diabetes.  A weakened immune system. SYMPTOMS  Symptoms can vary depending on the cause of the vaginitis. Common symptoms include:  Abnormal vaginal discharge.  The discharge is white, gray, or yellow with bacterial vaginosis.  The discharge is thick, white, and cheesy with a yeast infection.  The discharge is frothy and yellow or greenish with trichomoniasis.  A bad vaginal odor.  The odor is fishy with bacterial vaginosis.  Vaginal itching, pain, or swelling.  Painful intercourse.  Pain or burning when urinating. Sometimes, there are no symptoms. TREATMENT  Treatment will vary depending on the type of infection.   Bacterial vaginosis and trichomoniasis are often treated with antibiotic creams or pills.  Yeast infections are often treated with antifungal medicines, such as vaginal creams or suppositories.  Viral vaginitis has no cure, but symptoms can be treated with medicines that relieve discomfort. Your sexual partner should be treated as well.  Atrophic vaginitis may be treated with an estrogen cream, pill, suppository, or vaginal ring. If vaginal dryness occurs, lubricants and moisturizing creams may help. You may be told to avoid scented soaps, sprays, or douches.  Allergic vaginitis treatment  involves quitting the use of the product that is causing the problem. Vaginal creams can be used to treat the symptoms. HOME CARE INSTRUCTIONS   Take all medicines as directed by your caregiver.  Keep your genital area clean and dry. Avoid soap and only rinse the area with water.  Avoid douching. It can remove the healthy bacteria in the vagina.  Do not use tampons or have sexual intercourse until your vaginitis has been treated. Use sanitary pads while you have vaginitis.  Wipe from front to back. This avoids the spread of bacteria from the rectum to the vagina.  Let air reach your genital area.  Wear cotton underwear to decrease moisture buildup.  Avoid wearing underwear while you sleep until your vaginitis is gone.  Avoid tight pants and underwear or nylons without a cotton panel.  Take off wet clothing (especially bathing suits) as soon as possible.  Use mild, non-scented products. Avoid using irritants, such as:  Scented feminine sprays.  Fabric softeners.  Scented detergents.  Scented tampons.  Scented soaps or bubble baths.  Practice safe sex and use condoms. Condoms may prevent the spread of trichomoniasis and viral vaginitis. SEEK MEDICAL CARE IF:   You have abdominal pain.  You have a fever or persistent symptoms for more than 2-3 days.  You have a fever and your symptoms suddenly get worse. Document Released: 08/25/2007 Document Revised: 07/22/2012 Document Reviewed: 04/09/2012 [facility] Patient Information 2015 [place], [place]. This information is not intended to replace advice given to you by your health care provider. Make sure you discuss any questions you have with  your health care provider.

## 2014-11-28 LAB — GC/CHLAMYDIA PROBE AMP (~~LOC~~) NOT AT ARMC
Chlamydia: NEGATIVE
Neisseria Gonorrhea: NEGATIVE

## 2015-01-07 ENCOUNTER — Encounter (HOSPITAL_BASED_OUTPATIENT_CLINIC_OR_DEPARTMENT_OTHER): Payer: Self-pay | Admitting: *Deleted

## 2015-01-07 ENCOUNTER — Emergency Department (HOSPITAL_BASED_OUTPATIENT_CLINIC_OR_DEPARTMENT_OTHER)
Admission: EM | Admit: 2015-01-07 | Discharge: 2015-01-07 | Disposition: A | Discharge status: Home / Self Care | Payer: Medicaid Other | Attending: Emergency Medicine | Admitting: Emergency Medicine

## 2015-01-07 DIAGNOSIS — Z87440 Personal history of urinary (tract) infections: Secondary | ICD-10-CM | POA: Insufficient documentation

## 2015-01-07 DIAGNOSIS — B9689 Other specified bacterial agents as the cause of diseases classified elsewhere: Secondary | ICD-10-CM

## 2015-01-07 DIAGNOSIS — Z792 Long term (current) use of antibiotics: Secondary | ICD-10-CM | POA: Insufficient documentation

## 2015-01-07 DIAGNOSIS — N760 Acute vaginitis: Secondary | ICD-10-CM | POA: Insufficient documentation

## 2015-01-07 DIAGNOSIS — Z3202 Encounter for pregnancy test, result negative: Secondary | ICD-10-CM | POA: Insufficient documentation

## 2015-01-07 LAB — URINE MICROSCOPIC-ADD ON

## 2015-01-07 LAB — PREGNANCY, URINE: Preg Test, Ur: NEGATIVE

## 2015-01-07 LAB — URINALYSIS, ROUTINE W REFLEX MICROSCOPIC
Bilirubin Urine: NEGATIVE
Glucose, UA: NEGATIVE mg/dL
Hgb urine dipstick: NEGATIVE
KETONES UR: NEGATIVE mg/dL
Nitrite: NEGATIVE
PH: 6 (ref 5.0–8.0)
Protein, ur: NEGATIVE mg/dL
Specific Gravity, Urine: 1.027 (ref 1.005–1.030)
Urobilinogen, UA: 0.2 mg/dL (ref 0.0–1.0)

## 2015-01-07 LAB — WET PREP, GENITAL
Trich, Wet Prep: NONE SEEN
Yeast Wet Prep HPF POC: NONE SEEN

## 2015-01-07 LAB — RAPID HIV SCREEN (WH-MAU): Rapid HIV Screen: NONREACTIVE

## 2015-01-07 MED ORDER — METRONIDAZOLE 500 MG PO TABS: 500.0000 mg | ORAL_TABLET | Freq: Two times a day (BID) | ORAL | Status: DC

## 2015-01-07 NOTE — ED Notes (Signed)
Pt reports she has had vaginal odor and discharge. Pt report she was treated in January and did not finish her antibiotics because she lost them

## 2015-01-07 NOTE — Discharge Instructions (Signed)
Bacterial Vaginosis Bacterial vaginosis is a vaginal infection that occurs when the normal balance of bacteria in the vagina is disrupted. It results from an overgrowth of certain bacteria. This is the most common vaginal infection in women of childbearing age. Treatment is important to prevent complications, especially in pregnant women, as it can cause a premature delivery. CAUSES  Bacterial vaginosis is caused by an increase in harmful bacteria that are normally present in smaller amounts in the vagina. Several different kinds of bacteria can cause bacterial vaginosis. However, the reason that the condition develops is not fully understood. RISK FACTORS Certain activities or behaviors can put you at an increased risk of developing bacterial vaginosis, including:  Having a new sex partner or multiple sex partners.  Douching.  Using an intrauterine device (IUD) for contraception. Women do not get bacterial vaginosis from toilet seats, bedding, swimming pools, or contact with objects around them. SIGNS AND SYMPTOMS  Some women with bacterial vaginosis have no signs or symptoms. Common symptoms include:  Grey vaginal discharge.  A fishlike odor with discharge, especially after sexual intercourse.  Itching or burning of the vagina and vulva.  Burning or pain with urination. DIAGNOSIS  Your health care provider will take a medical history and examine the vagina for signs of bacterial vaginosis. A sample of vaginal fluid may be taken. Your health care provider will look at this sample under a microscope to check for bacteria and abnormal cells. A vaginal pH test may also be done.  TREATMENT  Bacterial vaginosis may be treated with antibiotic medicines. These may be given in the form of a pill or a vaginal cream. A second round of antibiotics may be prescribed if the condition comes back after treatment.  HOME CARE INSTRUCTIONS   Only take over-the-counter or prescription medicines as  directed by your health care provider.  If antibiotic medicine was prescribed, take it as directed. Make sure you finish it even if you start to feel better.  Do not have sex until treatment is completed.  Tell all sexual partners that you have a vaginal infection. They should see their health care provider and be treated if they have problems, such as a mild rash or itching.  Practice safe sex by using condoms and only having one sex partner. SEEK MEDICAL CARE IF:   Your symptoms are not improving after 3 days of treatment.  You have increased discharge or pain.  You have a fever. MAKE SURE YOU:   Understand these instructions.  Will watch your condition.  Will get help right away if you are not doing well or get worse. FOR MORE INFORMATION  Centers for Disease Control and Prevention, Division of STD Prevention: Appraiser[employer]raud[employer]fi American Sexual Health Association (ASHA): www.ashastd.org  Document Released: 10/28/2005 Document Revised: 08/18/2013 Document Reviewed: 06/09/2013 [facility] Patient Information 2015 [place], [place]. This information is not intended to replace advice given to you by your health care provider. Make sure you discuss any questions you have with your health care provider.  Metronidazole tablets or capsules What is this medicine? METRONIDAZOLE (me troe NI da zole) is an antiinfective. It is used to treat certain kinds of bacterial and protozoal infections. It will not work for colds, flu, or other viral infections. This medicine may be used for other purposes; ask your health care provider or pharmacist if you have questions. COMMON BRAND NAME(S): Flagyl What should I tell my health care provider before I take this medicine? They need to know if you have any  of these conditions: -anemia or other blood disorders -disease of the nervous system -fungal or yeast infection -if you drink alcohol containing drinks -liver disease -seizures -an unusual or allergic  reaction to metronidazole, or other medicines, foods, dyes, or preservatives -pregnant or trying to get pregnant -breast-feeding How should I use this medicine? Take this medicine by mouth with a full glass of water. Follow the directions on the prescription label. Take your medicine at regular intervals. Do not take your medicine more often than directed. Take all of your medicine as directed even if you think you are better. Do not skip doses or stop your medicine early. Talk to your pediatrician regarding the use of this medicine in children. Special care may be needed. Overdosage: If you think you have taken too much of this medicine contact a poison control center or emergency room at once. NOTE: This medicine is only for you. Do not share this medicine with others. What if I miss a dose? If you miss a dose, take it as soon as you can. If it is almost time for your next dose, take only that dose. Do not take double or extra doses. What may interact with this medicine? Do not take this medicine with any of the following medications: -alcohol or any product that contains alcohol -amprenavir oral solution -cisapride -disulfiram -dofetilide -dronedarone -paclitaxel injection -pimozide -ritonavir oral solution -sertraline oral solution -sulfamethoxazole-trimethoprim injection -thioridazine -ziprasidone This medicine may also interact with the following medications: -birth control pills -cimetidine -lithium -other medicines that prolong the QT interval (cause an abnormal heart rhythm) -phenobarbital -phenytoin -warfarin This list may not describe all possible interactions. Give your health care provider a list of all the medicines, herbs, non-prescription drugs, or dietary supplements you use. Also tell them if you smoke, drink alcohol, or use illegal drugs. Some items may interact with your medicine. What should I watch for while using this medicine? Tell your doctor or health care  professional if your symptoms do not improve or if they get worse. You may get drowsy or dizzy. Do not drive, use machinery, or do anything that needs mental alertness until you know how this medicine affects you. Do not stand or sit up quickly, especially if you are an older patient. This reduces the risk of dizzy or fainting spells. Avoid alcoholic drinks while you are taking this medicine and for three days afterward. Alcohol may make you feel dizzy, sick, or flushed. If you are being treated for a sexually transmitted disease, avoid sexual contact until you have finished your treatment. Your sexual partner may also need treatment. What side effects may I notice from receiving this medicine? Side effects that you should report to your doctor or health care professional as soon as possible: -allergic reactions like skin rash or hives, swelling of the face, lips, or tongue -confusion, clumsiness -difficulty speaking -discolored or sore mouth -dizziness -fever, infection -numbness, tingling, pain or weakness in the hands or feet -trouble passing urine or change in the amount of urine -redness, blistering, peeling or loosening of the skin, including inside the mouth -seizures -unusually weak or tired -vaginal irritation, dryness, or discharge Side effects that usually do not require medical attention (report to your doctor or health care professional if they continue or are bothersome): -diarrhea -headache -irritability -metallic taste -nausea -stomach pain or cramps -trouble sleeping This list may not describe all possible side effects. Call your doctor for medical advice about side effects. You may report side effects to FDA  at 1-800-FDA-1088. Where should I keep my medicine? Keep out of the reach of children. Store at room temperature below 25 degrees C (77 degrees F). Protect from light. Keep container tightly closed. Throw away any unused medicine after the expiration date. NOTE:  This sheet is a summary. It may not cover all possible information. If you have questions about this medicine, talk to your doctor, pharmacist, or health care provider.  2015, [employer]vier/Gold Standard. (2013-06-04 14:08:39)

## 2015-01-07 NOTE — ED Provider Notes (Signed)
CSN: [id number]     Arrival date & time 01/07/15  0510 History   First MD Initiated Contact with Patient 01/07/15 0515     Chief Complaint  Patient presents with  . Vaginal Discharge     (Consider location/radiation/quality/duration/timing/severity/associated sxs/prior Treatment) Patient is a 29 y.o. female presenting with vaginal discharge. The history is provided by the patient.  Vaginal Discharge She was in the emergency department on January 17 with complaints of malodorous vaginal discharge and found to have bacterial vaginosis and Candida vaginitis. She was discharged with prescriptions for fluconazole and metronidazole. She states she took both tablets of fluconazole, but stopped taking metronidazole after 2 or 3 days. Discharge did seem to get better initially but returned within a few days of discontinuing the metronidazole. She denies abdominal pain, pelvic pain, dyspareunia. She denies any urinary difficulty. Last menses was February 7 and was normal. She is status post tubal ligation.  Past Medical History  Diagnosis Date  . Urinary tract infection   . No pertinent past medical history    Past Surgical History  Procedure Laterality Date  . Cesarean section    . Cesarean section  2009    General Anesthesia  . Cesarean section  09/24/2012    Procedure: CESAREAN SECTION;  Surgeon: Shelly Bombard, MD;  Location: [facility];  Service: Obstetrics;  Laterality: N/A;  . Laparoscopic tubal ligation  11/20/2012    Procedure: LAPAROSCOPIC TUBAL LIGATION;  Surgeon: Lahoma Crocker, MD;  Location: [facility];  Service: Gynecology;  Laterality: Bilateral;   No family history on file. History  Substance Use Topics  . Smoking status: Never Smoker   . Smokeless tobacco: Never Used  . Alcohol Use: No   OB History    Gravida Para Term Preterm AB TAB SAB Ectopic Multiple Living   2 2 1       1      Review of Systems  Genitourinary: Positive for vaginal discharge.  All other systems  reviewed and are negative.     Allergies  Review of patient's allergies indicates no known allergies.  Home Medications   Prior to Admission medications   Medication Sig Start Date End Date Taking? Authorizing Provider  fluconazole (DIFLUCAN) 150 MG tablet Take 1 tablet (150 mg total) by mouth once. Take 1 tablet 48 hours after the first. 11/27/14   Carman Ching, PA-C  metroNIDAZOLE (FLAGYL) 500 MG tablet Take 1 tablet (500 mg total) by mouth 2 (two) times daily. One po bid x 7 days 11/27/14   Carman Ching, PA-C  ondansetron (ZOFRAN ODT) 8 MG disintegrating tablet 8mg  ODT q4 hours prn nausea 12/18/13   Veryl Speak, MD  oxyCODONE-acetaminophen (PERCOCET) 5-325 MG per tablet Take 2 tablets by mouth every 6 (six) hours as needed for pain. 11/20/12   Lahoma Crocker, MD  trimethoprim-polymyxin b (POLYTRIM) ophthalmic solution Place 1 drop into the right eye every 4 (four) hours. 09/13/13   Ezequiel Essex, MD  valACYclovir (VALTREX) 500 MG tablet TAKE 1 TABLET BY MOUTH TWICE A DAY FOR 5 DAYS THEN TAKE 1 TABLET EVERY DAY THEREAFTER 07/05/14   Shelly Bombard, MD   BP 121/81 mmHg  Pulse 70  Temp(Src) 98.1 F (36.7 C) (Oral)  Resp 18  Ht 5\' 3"  (1.6 m)  Wt 175 lb (79.379 kg)  BMI 31.01 kg/m2  SpO2 100%  LMP 12/18/2014 Physical Exam  Nursing note and vitals reviewed.  29 year old female, resting comfortably and in no acute distress. Vital  signs are normal. Oxygen saturation is 100%, which is normal. Head is normocephalic and atraumatic. PERRLA, EOMI. Oropharynx is clear. Neck is nontender and supple without adenopathy or JVD. Back is nontender and there is no CVA tenderness. Lungs are clear without rales, wheezes, or rhonchi. Chest is nontender. Heart has regular rate and rhythm without murmur. Abdomen is soft, flat, nontender without masses or hepatosplenomegaly and peristalsis is normoactive. Pelvic: Normal external female genitalia, cervix closed, no bleeding, small to moderate amount  of white vaginal discharge present. Fundus is normal size and position and nontender, no cervical motion tenderness, no adnexal masses or tenderness. Extremities have no cyanosis or edema, full range of motion is present. Skin is warm and dry without rash. Neurologic: Mental status is normal, cranial nerves are intact, there are no motor or sensory deficits.  ED Course  Procedures (including critical care time) Labs Review Results for orders placed or performed during the hospital encounter of 01/07/15  Wet prep, genital  Result Value Ref Range   Yeast Wet Prep HPF POC NONE SEEN NONE SEEN   Trich, Wet Prep NONE SEEN NONE SEEN   Clue Cells Wet Prep HPF POC TOO NUMEROUS TO COUNT (A) NONE SEEN   WBC, Wet Prep HPF POC MODERATE (A) NONE SEEN  Urinalysis, Routine w reflex microscopic  Result Value Ref Range   Color, Urine YELLOW YELLOW   APPearance CLOUDY (A) CLEAR   Specific Gravity, Urine 1.027 1.005 - 1.030   pH 6.0 5.0 - 8.0   Glucose, UA NEGATIVE NEGATIVE mg/dL   Hgb urine dipstick NEGATIVE NEGATIVE   Bilirubin Urine NEGATIVE NEGATIVE   Ketones, ur NEGATIVE NEGATIVE mg/dL   Protein, ur NEGATIVE NEGATIVE mg/dL   Urobilinogen, UA 0.2 0.0 - 1.0 mg/dL   Nitrite NEGATIVE NEGATIVE   Leukocytes, UA TRACE (A) NEGATIVE  Pregnancy, urine  Result Value Ref Range   Preg Test, Ur NEGATIVE NEGATIVE  Urine microscopic-add on  Result Value Ref Range   Squamous Epithelial / LPF FEW (A) RARE   WBC, UA 3-6 <3 WBC/hpf   RBC / HPF 0-2 <3 RBC/hpf   Bacteria, UA MANY (A) RARE   Urine-Other MUCOUS PRESENT    MDM   Final diagnoses:  Bacterial vaginosis    Vaginal discharge. Old records reviewed confirming ED visit on January 17 with diagnosis of bacterial vaginosis and Candida vaginitis and prescriptions for fluconazole and metronidazole. Wet prep will need to be repeated. GC and Chlamydia tests were negative at prior visit, but HIV and RPR were not checked. These will be done today.  Wet prep  confirms presence of bacterial vaginosis. She is discharged with prescription for metronidazole and is referred to women's clinic for follow-up.  Delora Fuel, MD 84/13/24 [id number]

## 2015-01-08 LAB — RPR: RPR Ser Ql: NONREACTIVE

## 2015-01-09 LAB — GC/CHLAMYDIA PROBE AMP (~~LOC~~) NOT AT ARMC
Chlamydia: NEGATIVE
NEISSERIA GONORRHEA: NEGATIVE

## 2015-02-11 ENCOUNTER — Encounter (HOSPITAL_COMMUNITY): Payer: Self-pay | Admitting: *Deleted

## 2015-02-11 ENCOUNTER — Emergency Department (INDEPENDENT_AMBULATORY_CARE_PROVIDER_SITE_OTHER)
Admission: EM | Admit: 2015-02-11 | Discharge: 2015-02-11 | Disposition: A | Discharge status: Home / Self Care | Payer: Self-pay | Source: Home / Self Care | Attending: Family Medicine | Admitting: Family Medicine

## 2015-02-11 DIAGNOSIS — B0089 Other herpesviral infection: Secondary | ICD-10-CM

## 2015-02-11 MED ORDER — VALACYCLOVIR HCL 500 MG PO TABS: ORAL_TABLET | ORAL | Status: DC

## 2015-02-11 NOTE — ED Provider Notes (Signed)
CSN: [id number]     Arrival date & time 02/11/15  1657 History   First MD Initiated Contact with Patient 02/11/15 1716     Chief Complaint  Patient presents with  . Eye Problem   (Consider location/radiation/quality/duration/timing/severity/associated sxs/prior Treatment) HPI Comments: Patient presents with a rash to right outer area of eye. Onset Thursday. No known exposure. Noted cluster of small bumps. Non-painful. No irritation to actual eye or drainage. No fevers.   Patient is a 29 y.o. female presenting with eye problem. The history is provided by the patient.  Eye Problem   Past Medical History  Diagnosis Date  . Urinary tract infection   . No pertinent past medical history    Past Surgical History  Procedure Laterality Date  . Cesarean section    . Cesarean section  2009    General Anesthesia  . Cesarean section  09/24/2012    Procedure: CESAREAN SECTION;  Surgeon: Shelly Bombard, MD;  Location: [facility];  Service: Obstetrics;  Laterality: N/A;  . Laparoscopic tubal ligation  11/20/2012    Procedure: LAPAROSCOPIC TUBAL LIGATION;  Surgeon: Lahoma Crocker, MD;  Location: [facility];  Service: Gynecology;  Laterality: Bilateral;   History reviewed. No pertinent family history. History  Substance Use Topics  . Smoking status: Never Smoker   . Smokeless tobacco: Never Used  . Alcohol Use: No   OB History    Gravida Para Term Preterm AB TAB SAB Ectopic Multiple Living   2 2 1       1      Review of Systems  All other systems reviewed and are negative.   Allergies  Review of patient's allergies indicates no known allergies.  Home Medications   Prior to Admission medications   Medication Sig Start Date End Date Taking? Authorizing Provider  fluconazole (DIFLUCAN) 150 MG tablet Take 1 tablet (150 mg total) by mouth once. Take 1 tablet 48 hours after the first. 11/27/14   Carman Ching, PA-C  metroNIDAZOLE (FLAGYL) 500 MG tablet Take 1 tablet (500 mg total) by mouth 2  (two) times daily. One po bid x 7 days 1/75/10   Delora Fuel, MD  ondansetron (ZOFRAN ODT) 8 MG disintegrating tablet 8mg  ODT q4 hours prn nausea 12/18/13   Veryl Speak, MD  oxyCODONE-acetaminophen (PERCOCET) 5-325 MG per tablet Take 2 tablets by mouth every 6 (six) hours as needed for pain. 11/20/12   Lahoma Crocker, MD  trimethoprim-polymyxin b (POLYTRIM) ophthalmic solution Place 1 drop into the right eye every 4 (four) hours. 09/13/13   Ezequiel Essex, MD  valACYclovir (VALTREX) 500 MG tablet TAKE 1 TABLET BY MOUTH TWICE A DAY FOR 7 days 02/11/15   Bjorn Pippin, PA-C   BP 119/71 mmHg  Pulse 82  Temp(Src) 99.2 F (37.3 C) (Oral)  Resp 16  SpO2 100%  LMP 02/01/2015 Physical Exam  Constitutional: She is oriented to person, place, and time. She appears well-developed and well-nourished. No distress.  HENT:  Head: Normocephalic and atraumatic.  Eyes: Pupils are equal, round, and reactive to light. Right eye exhibits no discharge. Left eye exhibits no discharge.  Small cluster of vesicles to right outer eye without involvement to the inner eye. No pain with palpation. No drainage  Neurological: She is alert and oriented to person, place, and time.  Skin: Skin is warm and dry. Rash noted. She is not diaphoretic.  Psychiatric: Her behavior is normal.  Nursing note and vitals reviewed.   ED Course  Procedures (including  critical care time) Labs Review Labs Reviewed  HERPES SIMPLEX VIRUS CULTURE    Imaging Review No results found.   MDM   1. Herpes dermatitis    Discussed with Dr. Georgina Snell. 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 Will culture today. Treat empirically with Valtrex. May use local 0.5% cortisone cream cautiously to area. Benadryl or Zyrtec if needed for itching. FU if worsens    Bjorn Pippin, PA-C 02/11/15 0300

## 2015-02-11 NOTE — Discharge Instructions (Signed)
Herpes Simplex Virus Herpes simplex virus is a viral infection that may infect many different areas of the body, such as the genitalia and mouth. There are two different strains of the virus: herpes simplex virus 1 (HSV-1) and herpes simplex virus 2 (HSV-2). HSV-1 is typically associated with infections of the mouth and lips. HSV-2 is associated with infections of the genitals. However, either strain of the virus may infect any area. HSV may be spread through saliva particles or sexual contact. One unusual form of HSV-1, known as herpes gladiatorum, is passed from skin-to-skin contact, such as in wrestling. SYMPTOMS   Sometimes, no symptoms.  Fever.  Headache.  Muscle aches.  Tingling.  Itching.  Tenderness.  Genital burning feeling.  Genital pain.  Pain with urination.  Pain with sexual intercourse.  Small blisters in the affected areas. RISK FACTORS   Kissing an infected person.  Sharing eating utensils with an infected person.  Unprotected sexual activity.  Multiple sexual partners.  Direct contact sports without protective clothing.  Contact with an exposed herpes sore.  Stress, illness, and cold increase the risk of recurrence. PROGNOSIS  The primary outbreak of an HSV infection usually lasts 2 to 3 weeks. However, it has been known to last up to 6 weeks. After the primary outbreak subsides, the virus goes into a stage known as latency. During this time, there may be no physical symptoms of infection. After a period of time, some event, such as stress, cold, or illness will trigger another outbreak. This cycle of latency and outbreak may continue indefinitely. The outbreaks usually become milder over time. The body cannot rid itself of HSV. RELATED COMPLICATIONS   Recurrence.  Infection in other areas of the body, such as the eye (ocular herpetic infection, keratitis) and rarely the brain (herpetic encephalitis). TREATMENT  Many HSV infections can be treated  without medicine. During an outbreak, avoid touching the sores. Ice may be used to dull the pain and suppress the virus. Exposure to the sun is a common trigger for an outbreak, so the use of sunscreen may help in such cases. Avoid sexual contact during outbreaks. During the latent periods, it is advised that you use latex condoms, which will reduce the likelihood of spreading the virus to another person. Condoms made from animal products do not protect against HSV. Female condoms cover a larger area than female condoms, and may offer the most protection from the transmission of HSV. The presence of HSV will not affect a condom's ability to protect against pregnancy. Only take medicines for pain and discomfort if directed to do so by your caregiver. Many claims exist that certain dietary changes will prevent an outbreak, but these claims have not been proven. These claims include eating foods that are high in L-lysine and low in arginine (i.e. yogurt, beets, apples, pears, mangoes, oily fish (such as salmon, haddock, snapper, and swordfish), soybean sprouts, chicken, and tomatoes).  Athletes may return to play once they are showing no symptoms, and they have been treated.  Document Released: 10/28/2005 Document Revised: 01/20/2012 Document Reviewed: 02/09/2009 [facility] Patient Information 2015 [place], [place]. This information is not intended to replace advice given to you by your health care provider. Make sure you discuss any questions you have with your health care provider.   Cortisone Cream 0.5% use LIGHTLY to area 2x day for no more than 5 days. Take Benadryl 25mg  every 4-6 hours over the next 2 days for itching

## 2015-02-11 NOTE — ED Notes (Signed)
C/o R eye itching onset Thur.  It was a little swollen on Fri. And looked like a mosquito bite.  Now it is a cluster of small bumps. No pain or burning.

## 2015-02-13 ENCOUNTER — Telehealth (HOSPITAL_COMMUNITY): Payer: Self-pay | Admitting: Family Medicine

## 2015-02-13 LAB — HERPES SIMPLEX VIRUS CULTURE
Culture: DETECTED
SPECIAL REQUESTS: NORMAL

## 2015-02-13 NOTE — ED Notes (Signed)
Patient return to clinic. She is worse. I have referred patient to [facility]. She has an appointment now.  Gregor Hams, MD 02/13/15 [phone]

## 2015-02-15 ENCOUNTER — Telehealth (HOSPITAL_COMMUNITY): Payer: Self-pay | Admitting: *Deleted

## 2015-02-15 NOTE — ED Notes (Signed)
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  I called pt. Pt. verified x 2 and given result.  Pt. told she was adequately treated with the Valtrex and to finish all of the medication.  Pt. instructed to get treated for each outbreak with Acyclovir or Valtrex and that she may want to get a PCP who can give her a 1 yr. Rx. to fill at the beginning of each outbreak or suppressive treatment if needed.  Pt. had not questions.  Roselyn Meier 02/15/2015

## 2015-03-26 ENCOUNTER — Encounter (HOSPITAL_BASED_OUTPATIENT_CLINIC_OR_DEPARTMENT_OTHER): Payer: Self-pay | Admitting: *Deleted

## 2015-03-26 ENCOUNTER — Emergency Department (HOSPITAL_BASED_OUTPATIENT_CLINIC_OR_DEPARTMENT_OTHER)
Admission: EM | Admit: 2015-03-26 | Discharge: 2015-03-26 | Disposition: A | Discharge status: Home / Self Care | Payer: Self-pay | Attending: Emergency Medicine | Admitting: Emergency Medicine

## 2015-03-26 DIAGNOSIS — Z87440 Personal history of urinary (tract) infections: Secondary | ICD-10-CM | POA: Insufficient documentation

## 2015-03-26 DIAGNOSIS — Z792 Long term (current) use of antibiotics: Secondary | ICD-10-CM | POA: Insufficient documentation

## 2015-03-26 DIAGNOSIS — L918 Other hypertrophic disorders of the skin: Secondary | ICD-10-CM | POA: Insufficient documentation

## 2015-03-26 NOTE — ED Provider Notes (Signed)
CSN: [id number]     Arrival date & time 03/26/15  0946 History   First MD Initiated Contact with Patient 03/26/15 1020     Chief Complaint  Patient presents with  . skin tag      (Consider location/radiation/quality/duration/timing/severity/associated sxs/prior Treatment) HPI  29 year old female with no significant past medical history presents with a left thigh skin tag seems to have grown. No tenderness or pain. Does not rub against her other leg when walking. She is concerned that it is fluid-filled and needs to be removed. Denies any fevers. No redness.  Past Medical History  Diagnosis Date  . Urinary tract infection   . No pertinent past medical history    Past Surgical History  Procedure Laterality Date  . Cesarean section    . Cesarean section  2009    General Anesthesia  . Cesarean section  09/24/2012    Procedure: CESAREAN SECTION;  Surgeon: Shelly Bombard, MD;  Location: [facility];  Service: Obstetrics;  Laterality: N/A;  . Laparoscopic tubal ligation  11/20/2012    Procedure: LAPAROSCOPIC TUBAL LIGATION;  Surgeon: Lahoma Crocker, MD;  Location: [facility];  Service: Gynecology;  Laterality: Bilateral;   No family history on file. History  Substance Use Topics  . Smoking status: Never Smoker   . Smokeless tobacco: Never Used  . Alcohol Use: No   OB History    Gravida Para Term Preterm AB TAB SAB Ectopic Multiple Living   2 2 1       1      Review of Systems  Constitutional: Negative for fever.  Skin: Negative for color change, rash and wound.       Skin tag left thigh  All other systems reviewed and are negative.     Allergies  Review of patient's allergies indicates no known allergies.  Home Medications   Prior to Admission medications   Medication Sig Start Date End Date Taking? Authorizing Provider  fluconazole (DIFLUCAN) 150 MG tablet Take 1 tablet (150 mg total) by mouth once. Take 1 tablet 48 hours after the first. 11/27/14   Carman Ching, PA-C    metroNIDAZOLE (FLAGYL) 500 MG tablet Take 1 tablet (500 mg total) by mouth 2 (two) times daily. One po bid x 7 days 2/37/62   Delora Fuel, MD  ondansetron (ZOFRAN ODT) 8 MG disintegrating tablet 8mg  ODT q4 hours prn nausea 12/18/13   Veryl Speak, MD  oxyCODONE-acetaminophen (PERCOCET) 5-325 MG per tablet Take 2 tablets by mouth every 6 (six) hours as needed for pain. 11/20/12   Lahoma Crocker, MD  trimethoprim-polymyxin b (POLYTRIM) ophthalmic solution Place 1 drop into the right eye every 4 (four) hours. 09/13/13   Ezequiel Essex, MD  valACYclovir (VALTREX) 500 MG tablet TAKE 1 TABLET BY MOUTH TWICE A DAY FOR 7 days 02/11/15   Bjorn Pippin, PA-C   BP 113/75 mmHg  Pulse 70  Temp(Src) 98.2 F (36.8 C) (Oral)  Resp 18  Ht 5\' 3"  (1.6 m)  Wt 177 lb (80.287 kg)  BMI 31.36 kg/m2  SpO2 100% Physical Exam  Constitutional: She is oriented to person, place, and time. She appears well-developed and well-nourished. No distress.  HENT:  Head: Normocephalic and atraumatic.  Eyes: Right eye exhibits no discharge. Left eye exhibits no discharge.  Pulmonary/Chest: Effort normal.  Abdominal: She exhibits no distension.  Neurological: She is alert and oriented to person, place, and time.  Skin: Skin is warm and dry. She is not diaphoretic. No erythema.  Nursing note and vitals reviewed.   ED Course  Procedures (including critical care time) Labs Review Labs Reviewed - No data to display  Imaging Review No results found.   EKG Interpretation None      MDM   Final diagnoses:  Skin tag    Patient with an enlarged skin tag to left medial thigh. No signs of cellulitis or infection such as abscess. Discussed removal the patient states if it does not need to be removed she prefers not to have a procedure. This does not seem to be bothering her daily. Discussed general health maintenance as well as return precautions.    Sherwood Gambler, MD 03/26/15 1046

## 2015-03-26 NOTE — ED Notes (Signed)
Patient c/o skin tag L upper thigh that has recently grown larger, no c/o pain

## 2015-03-26 NOTE — ED Notes (Signed)
Patient not in room for discharge education, left prior to discharge

## 2015-03-26 NOTE — Discharge Instructions (Signed)
You appear to have a skin tag in your upper left thigh. If this worsens or you develop pain, redness or tenderness, return to ER or urgent care for possible removal. Follow up with a primary doctor for general evaluation and health maintenance.

## 2015-04-19 ENCOUNTER — Emergency Department (HOSPITAL_BASED_OUTPATIENT_CLINIC_OR_DEPARTMENT_OTHER)
Admission: EM | Admit: 2015-04-19 | Discharge: 2015-04-19 | Disposition: A | Discharge status: Home / Self Care | Payer: Medicaid Other | Attending: Emergency Medicine | Admitting: Emergency Medicine

## 2015-04-19 ENCOUNTER — Encounter (HOSPITAL_BASED_OUTPATIENT_CLINIC_OR_DEPARTMENT_OTHER): Payer: Self-pay | Admitting: Emergency Medicine

## 2015-04-19 DIAGNOSIS — M62830 Muscle spasm of back: Secondary | ICD-10-CM | POA: Insufficient documentation

## 2015-04-19 DIAGNOSIS — Z87440 Personal history of urinary (tract) infections: Secondary | ICD-10-CM | POA: Insufficient documentation

## 2015-04-19 DIAGNOSIS — Z792 Long term (current) use of antibiotics: Secondary | ICD-10-CM | POA: Insufficient documentation

## 2015-04-19 MED ORDER — DIAZEPAM 5 MG PO TABS: 5.0000 mg | ORAL_TABLET | Freq: Three times a day (TID) | ORAL | Status: DC | PRN

## 2015-04-19 NOTE — ED Notes (Signed)
Patient reports that she is having back spasms to her right mid back

## 2015-04-19 NOTE — ED Notes (Signed)
MD at bedside. 

## 2015-04-19 NOTE — ED Provider Notes (Signed)
CSN: [id number]     Arrival date & time 04/19/15  0256 History   First MD Initiated Contact with Patient 04/19/15 0535     Chief Complaint  Patient presents with  . Back Pain     (Consider location/radiation/quality/duration/timing/severity/associated sxs/prior Treatment) HPI  This is a 29 year old female who awakened this morning with pain in her back. The pain is located in the musculature medial to the left scapula. She describes the pain as feeling like muscle spasms. They're intermittent and occur with certain movements. They're brief in duration. She denies recent injury.  Past Medical History  Diagnosis Date  . Urinary tract infection    Past Surgical History  Procedure Laterality Date  . Cesarean section    . Cesarean section  2009    General Anesthesia  . Cesarean section  09/24/2012    Procedure: CESAREAN SECTION;  Surgeon: Shelly Bombard, MD;  Location: [facility];  Service: Obstetrics;  Laterality: N/A;  . Laparoscopic tubal ligation  11/20/2012    Procedure: LAPAROSCOPIC TUBAL LIGATION;  Surgeon: Lahoma Crocker, MD;  Location: [facility];  Service: Gynecology;  Laterality: Bilateral;   History reviewed. No pertinent family history. History  Substance Use Topics  . Smoking status: Never Smoker   . Smokeless tobacco: Never Used  . Alcohol Use: No   OB History    Gravida Para Term Preterm AB TAB SAB Ectopic Multiple Living   2 2 1       1      Review of Systems  All other systems reviewed and are negative.   Allergies  Review of patient's allergies indicates no known allergies.  Home Medications   Prior to Admission medications   Medication Sig Start Date End Date Taking? Authorizing Provider  fluconazole (DIFLUCAN) 150 MG tablet Take 1 tablet (150 mg total) by mouth once. Take 1 tablet 48 hours after the first. 11/27/14   Carman Ching, PA-C  metroNIDAZOLE (FLAGYL) 500 MG tablet Take 1 tablet (500 mg total) by mouth 2 (two) times daily. One po bid x 7 days  9/93/57   Delora Fuel, MD  ondansetron (ZOFRAN ODT) 8 MG disintegrating tablet 8mg  ODT q4 hours prn nausea 12/18/13   Veryl Speak, MD  oxyCODONE-acetaminophen (PERCOCET) 5-325 MG per tablet Take 2 tablets by mouth every 6 (six) hours as needed for pain. 11/20/12   Lahoma Crocker, MD  trimethoprim-polymyxin b (POLYTRIM) ophthalmic solution Place 1 drop into the right eye every 4 (four) hours. 09/13/13   Ezequiel Essex, MD  valACYclovir (VALTREX) 500 MG tablet TAKE 1 TABLET BY MOUTH TWICE A DAY FOR 7 days 02/11/15   Bjorn Pippin, PA-C   BP 108/46 mmHg  Pulse 66  Temp(Src) 98.2 F (36.8 C) (Oral)  Resp 16  Ht 5\' 3"  (1.6 m)  Wt 173 lb (78.472 kg)  BMI 30.65 kg/m2  SpO2 98%  LMP 04/12/2015   Physical Exam  General: Well-developed, well-nourished female in no acute distress; appearance consistent with age of record HENT: normocephalic; atraumatic Eyes: Normal appearance Neck: supple Heart: regular rate and rhythm Lungs: clear to auscultation bilaterally Abdomen: soft; nondistended; nontender Back: Tender muscles medial to the left scapula Extremities: No deformity; full range of motion Neurologic: Awake, alert and oriented; motor function intact in all extremities and symmetric; no facial droop Skin: Warm and dry Psychiatric: Normal mood and affect    ED Course  Procedures (including critical care time)   MDM    Shanon Rosser, MD 04/19/15 [phone]

## 2015-04-20 ENCOUNTER — Emergency Department (HOSPITAL_BASED_OUTPATIENT_CLINIC_OR_DEPARTMENT_OTHER): Admission: EM | Admit: 2015-04-20 | Discharge: 2015-04-20 | Discharge status: Absent without leave | Payer: Self-pay

## 2015-07-25 ENCOUNTER — Other Ambulatory Visit: Payer: Self-pay | Admitting: Obstetrics

## 2015-08-26 ENCOUNTER — Encounter (HOSPITAL_BASED_OUTPATIENT_CLINIC_OR_DEPARTMENT_OTHER): Payer: Self-pay

## 2015-08-26 ENCOUNTER — Emergency Department (HOSPITAL_BASED_OUTPATIENT_CLINIC_OR_DEPARTMENT_OTHER)
Admission: EM | Admit: 2015-08-26 | Discharge: 2015-08-26 | Disposition: A | Discharge status: Home / Self Care | Payer: Medicaid Other | Attending: Physician Assistant | Admitting: Physician Assistant

## 2015-08-26 DIAGNOSIS — Z3202 Encounter for pregnancy test, result negative: Secondary | ICD-10-CM | POA: Insufficient documentation

## 2015-08-26 DIAGNOSIS — B9689 Other specified bacterial agents as the cause of diseases classified elsewhere: Secondary | ICD-10-CM

## 2015-08-26 DIAGNOSIS — N760 Acute vaginitis: Secondary | ICD-10-CM | POA: Insufficient documentation

## 2015-08-26 DIAGNOSIS — Z87440 Personal history of urinary (tract) infections: Secondary | ICD-10-CM | POA: Insufficient documentation

## 2015-08-26 LAB — URINALYSIS, ROUTINE W REFLEX MICROSCOPIC
BILIRUBIN URINE: NEGATIVE
Glucose, UA: NEGATIVE mg/dL
Hgb urine dipstick: NEGATIVE
Ketones, ur: NEGATIVE mg/dL
LEUKOCYTES UA: NEGATIVE
Nitrite: NEGATIVE
Protein, ur: NEGATIVE mg/dL
SPECIFIC GRAVITY, URINE: 1.022 (ref 1.005–1.030)
UROBILINOGEN UA: 0.2 mg/dL (ref 0.0–1.0)
pH: 7 (ref 5.0–8.0)

## 2015-08-26 LAB — WET PREP, GENITAL
Clue Cells Wet Prep HPF POC: NONE SEEN
TRICH WET PREP: NONE SEEN
Yeast Wet Prep HPF POC: NONE SEEN

## 2015-08-26 LAB — PREGNANCY, URINE: PREG TEST UR: NEGATIVE

## 2015-08-26 MED ORDER — METRONIDAZOLE 500 MG PO TABS: 500.0000 mg | ORAL_TABLET | Freq: Two times a day (BID) | ORAL | Status: DC

## 2015-08-26 MED ORDER — METRONIDAZOLE 500 MG PO TABS
500.0000 mg | ORAL_TABLET | Freq: Once | ORAL | Status: AC
  Administered 2015-08-26: 500 mg via ORAL
  Filled 2015-08-26: qty 1

## 2015-08-26 NOTE — ED Provider Notes (Signed)
CSN: [id number]     Arrival date & time 08/26/15  9323 History   First MD Initiated Contact with Patient 08/26/15 1052     Chief Complaint  Patient presents with  . Vaginal Discharge     (Consider location/radiation/quality/duration/timing/severity/associated sxs/prior Treatment) HPI    She is a very pleasant 29 year old female presenting with symptoms of bacterial vaginosis. She states she's had it multiple times in the past. It is accompanied by fish odor and a thin discharge.  Patient has not had any new sexual partners. She is with her husband only. Patient reports no new antibiotics or other medications.  Patient does use an antibacterial soap wash, Centaphil for washing.     Past Medical History  Diagnosis Date  . Urinary tract infection    Past Surgical History  Procedure Laterality Date  . Cesarean section    . Cesarean section  2009    General Anesthesia  . Cesarean section  09/24/2012    Procedure: CESAREAN SECTION;  Surgeon: Shelly Bombard, MD;  Location: [facility];  Service: Obstetrics;  Laterality: N/A;  . Laparoscopic tubal ligation  11/20/2012    Procedure: LAPAROSCOPIC TUBAL LIGATION;  Surgeon: Lahoma Crocker, MD;  Location: [facility];  Service: Gynecology;  Laterality: Bilateral;   No family history on file. Social History  Substance Use Topics  . Smoking status: Never Smoker   . Smokeless tobacco: Never Used  . Alcohol Use: No   OB History    Gravida Para Term Preterm AB TAB SAB Ectopic Multiple Living   2 2 1       1      Review of Systems  Constitutional: Negative for activity change.  Respiratory: Negative for shortness of breath.   Cardiovascular: Negative for chest pain.  Gastrointestinal: Negative for abdominal pain.  Genitourinary: Positive for vaginal discharge. Negative for urgency, decreased urine volume, genital sores, menstrual problem and pelvic pain.      Allergies  Review of patient's allergies indicates no known  allergies.  Home Medications   Prior to Admission medications   Medication Sig Start Date End Date Taking? Authorizing Provider  diazepam (VALIUM) 5 MG tablet Take 1 tablet (5 mg total) by mouth every 8 (eight) hours as needed for muscle spasms (may cause drowsiness). 04/19/15  Yes John Molpus, MD  valACYclovir (VALTREX) 500 MG tablet TAKE 1 TABLET BY MOUTH TWICE A DAY FOR 5 DAYS THEN TAKE 1 TABLET EVERY DAY THEREAFTER 07/25/15  Yes Shelly Bombard, MD   BP 116/76 mmHg  Pulse 81  Temp(Src) 98.2 F (36.8 C) (Oral)  Resp 18  Ht 5\' 3"  (1.6 m)  Wt 190 lb (86.183 kg)  BMI 33.67 kg/m2  SpO2 100%  LMP 08/18/2015 Physical Exam  Constitutional: She is oriented to person, place, and time. She appears well-developed and well-nourished.  HENT:  Head: Normocephalic and atraumatic.  Eyes: Right eye exhibits no discharge.  Cardiovascular: Normal rate.   Pulmonary/Chest: Effort normal.  Genitourinary: Vaginal discharge found.  Normal-appearing cervix. No cervical motion tenderness. Thin whitish discharge.  Neurological: She is oriented to person, place, and time.  Skin: Skin is warm and dry. She is not diaphoretic.  Psychiatric: She has a normal mood and affect.  Nursing note and vitals reviewed.   ED Course  Procedures (including critical care time) Labs Review Labs Reviewed  URINALYSIS, ROUTINE W REFLEX MICROSCOPIC (NOT AT [facility]) - Abnormal; Notable for the following:    APPearance CLOUDY (*)    All other components within  normal limits  WET PREP, GENITAL  PREGNANCY, URINE  HIV ANTIBODY (ROUTINE TESTING)  GC/CHLAMYDIA PROBE AMP ([place]) NOT AT [facility]    Imaging Review No results found. I have personally reviewed and evaluated these images and lab results as part of my medical decision-making.   EKG Interpretation None      MDM   Final diagnoses:  None    Patient is a pleasant 28 year old female presenting with recurrent bacterial vaginosis. Patient has smelly white  discharge. She is using a antibacterial body wash. I'm concerned that this is disrupting her pH in her vagina. We discussed using Dove or others soap for vaginal use.  Will treat presumptively.    Sapir Lavey Julio Alm, MD 08/26/15 1119

## 2015-08-26 NOTE — Discharge Instructions (Signed)

## 2015-08-26 NOTE — ED Notes (Signed)
Patient here with vaginal discharge x 1 week. history of BV and thinks she has it again. No urinary complaints, no other associated symptoms.

## 2015-08-27 LAB — HIV ANTIBODY (ROUTINE TESTING W REFLEX): HIV Screen 4th Generation wRfx: NONREACTIVE

## 2015-08-28 LAB — GC/CHLAMYDIA PROBE AMP (~~LOC~~) NOT AT ARMC
Chlamydia: NEGATIVE
Neisseria Gonorrhea: NEGATIVE

## 2015-09-24 ENCOUNTER — Encounter (HOSPITAL_BASED_OUTPATIENT_CLINIC_OR_DEPARTMENT_OTHER): Payer: Self-pay | Admitting: *Deleted

## 2015-09-24 ENCOUNTER — Emergency Department (HOSPITAL_BASED_OUTPATIENT_CLINIC_OR_DEPARTMENT_OTHER)
Admission: EM | Admit: 2015-09-24 | Discharge: 2015-09-24 | Disposition: A | Discharge status: Home / Self Care | Payer: Medicaid Other | Attending: Emergency Medicine | Admitting: Emergency Medicine

## 2015-09-24 DIAGNOSIS — Z87440 Personal history of urinary (tract) infections: Secondary | ICD-10-CM | POA: Insufficient documentation

## 2015-09-24 DIAGNOSIS — Z79899 Other long term (current) drug therapy: Secondary | ICD-10-CM | POA: Insufficient documentation

## 2015-09-24 DIAGNOSIS — M26609 Unspecified temporomandibular joint disorder, unspecified side: Secondary | ICD-10-CM | POA: Insufficient documentation

## 2015-09-24 MED ORDER — DIAZEPAM 5 MG PO TABS: 5.0000 mg | ORAL_TABLET | Freq: Three times a day (TID) | ORAL | Status: DC | PRN

## 2015-09-24 NOTE — ED Notes (Addendum)
C/o L lower tooth ache x 4 weeks. Here for L ear pain, onset 2 weeks ago. Rates 8/10. (Denies: other ear or tooth sx; Denies: fever, swelling, drainage, nv, dizziness, or hearing change), took ibuprofen 800mg  at 2100. Scheduled for tooth extraction this week. Has 1 more dose left of abx (amox). Alert, NAD, calm, interactive.

## 2015-09-24 NOTE — ED Notes (Signed)
Dr. Molpus into room 

## 2015-09-24 NOTE — ED Provider Notes (Signed)
CSN: [id number]     Arrival date & time 09/24/15  0128 History   First MD Initiated Contact with Patient 09/24/15 0145     Chief Complaint  Patient presents with  . Ear Pain      (Consider location/radiation/quality/duration/timing/severity/associated sxs/prior Treatment) HPI  This is a 29 year old female with a two-week history of pain in her left ear. The pain comes and goes. She does not aware of any exacerbating or mitigating factors. The pain is moderate in severity. She is scheduled to have a tooth pulled tomorrow but does not attribute the pain to the tooth. She reports no change in hearing.  Past Medical History  Diagnosis Date  . Urinary tract infection    Past Surgical History  Procedure Laterality Date  . Cesarean section    . Cesarean section  2009    General Anesthesia  . Cesarean section  09/24/2012    Procedure: CESAREAN SECTION;  Surgeon: Shelly Bombard, MD;  Location: [facility];  Service: Obstetrics;  Laterality: N/A;  . Laparoscopic tubal ligation  11/20/2012    Procedure: LAPAROSCOPIC TUBAL LIGATION;  Surgeon: Lahoma Crocker, MD;  Location: [facility];  Service: Gynecology;  Laterality: Bilateral;   History reviewed. No pertinent family history. Social History  Substance Use Topics  . Smoking status: Never Smoker   . Smokeless tobacco: Never Used  . Alcohol Use: No   OB History    Gravida Para Term Preterm AB TAB SAB Ectopic Multiple Living   2 2 1       1      Review of Systems  All other systems reviewed and are negative.   Allergies  Review of patient's allergies indicates no known allergies.  Home Medications   Prior to Admission medications   Medication Sig Start Date End Date Taking? Authorizing Provider  diazepam (VALIUM) 5 MG tablet Take 1 tablet (5 mg total) by mouth every 8 (eight) hours as needed for muscle spasms (may cause drowsiness). 04/19/15   Beya Tipps, MD  metroNIDAZOLE (FLAGYL) 500 MG tablet Take 1 tablet (500 mg total) by mouth 2  (two) times daily. 08/26/15   Courteney Lyn Mackuen, MD  valACYclovir (VALTREX) 500 MG tablet TAKE 1 TABLET BY MOUTH TWICE A DAY FOR 5 DAYS THEN TAKE 1 TABLET EVERY DAY THEREAFTER 07/25/15   Shelly Bombard, MD   BP 134/87 mmHg  Pulse 80  Temp(Src) 98.1 F (36.7 C) (Oral)  Resp 20  Ht 5\' 4"  (1.626 m)  Wt 190 lb (86.183 kg)  BMI 32.60 kg/m2  SpO2 100%  LMP 09/10/2015 (Approximate)   Physical Exam  General: Well-developed, well-nourished female in no acute distress; appearance consistent with age of record HENT: normocephalic; atraumatic; TMs normal; no pain on movement of external ear; tenderness of left TMJ notably on opening and closing of the mouth Eyes: pupils equal, round and reactive to light; extraocular muscles intact Neck: supple Heart: regular rate and rhythm Lungs: clear to auscultation bilaterally Abdomen: soft; nondistended Extremities: No deformity; full range of motion Neurologic: Awake, alert and oriented; motor function intact in all extremities and symmetric; no facial droop Skin: Warm and dry Psychiatric: Normal mood and affect    ED Course  Procedures (including critical care time)   MDM  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 She was advised to discuss this with her dentist tomorrow.     Shanon Rosser, MD 09/24/15 [phone]

## 2016-10-06 ENCOUNTER — Telehealth: Payer: Medicaid Other | Admitting: Family

## 2016-10-06 DIAGNOSIS — N3000 Acute cystitis without hematuria: Secondary | ICD-10-CM

## 2016-10-06 MED ORDER — NITROFURANTOIN MONOHYD MACRO 100 MG PO CAPS: 100.0000 mg | ORAL_CAPSULE | Freq: Two times a day (BID) | ORAL | 0 refills | Status: DC

## 2016-10-06 NOTE — Progress Notes (Signed)

## 2018-02-17 ENCOUNTER — Telehealth: Payer: Self-pay | Admitting: Hematology

## 2018-02-17 ENCOUNTER — Encounter: Payer: Self-pay | Admitting: Hematology

## 2018-02-17 NOTE — Telephone Encounter (Signed)
Appt has been scheduled for the pt to see Dr. Burr Medico on 4/19 at 230pm. Pt aware to arrive 30 minutes early. Letter mailed to the pt.

## 2018-02-26 NOTE — Progress Notes (Signed)
[place]  Telephone:(336) [phone] Fax:([phone]) [facility] consult Note   Patient Care Team: Antony Contras, MD as PCP - General (Family Medicine) Truitt Merle, MD as Consulting Physician (Hematology) 02/27/2018  First physician: Antony Contras, MD   CHIEF COMPLAINTS/PURPOSE OF CONSULTATION:  Iron deficient Anemia   HISTORY OF PRESENTING ILLNESS:  Donna Conley 32 y.o. female is a here because of newly diagnosed Iron deficient anemia. The patient was referred by PCP Antony Contras. The patient presents to the clinic today by herself.   She was found to have abnormal CBC from Feb and March 2019 with Hgb of 9.2 and 9.9, low HCT, low MCV, low MCH, and high RDW. However, she states she has had anemia for most of her life. She takes OTC ferrous sulfate about 3 times a week for the past 3 months. She reports her period is mostly regular and lasts 4-5 days and is not very heavy. She does have some fatigue but it is manageable.   She denies recent chest pain on exertion, shortness of breath on minimal exertion, pre-syncopal episodes, or palpitations. She had not noticed any recent bleeding such as epistaxis, hematuria or hematochezia. The patient denies over the counter NSAID ingestion. She is not on antiplatelets agents. She denies any GI symptoms and reports normal bowel movements. She had no prior history or diagnosis of cancer. Her age appropriate screening programs are up-to-date.She never donated blood or received blood transfusion  She has a no past peritinent medical history. She has a surgical history of C-section. She has a FMHx of iron deficiency by her mother and grandmother. She denies tobacco use and alcohol use. Socially, she is married and she has two children.  MEDICAL HISTORY:  Past Medical History:  Diagnosis Date  . Urinary tract infection     SURGICAL HISTORY: Past Surgical History:  Procedure Laterality Date  . CESAREAN SECTION    . CESAREAN  SECTION  2009   General Anesthesia  . CESAREAN SECTION  09/24/2012   Procedure: CESAREAN SECTION;  Surgeon: Shelly Bombard, MD;  Location: [facility];  Service: Obstetrics;  Laterality: N/A;  . LAPAROSCOPIC TUBAL LIGATION  11/20/2012   Procedure: LAPAROSCOPIC TUBAL LIGATION;  Surgeon: Lahoma Crocker, MD;  Location: [facility];  Service: Gynecology;  Laterality: Bilateral;    SOCIAL HISTORY: Social History   Socioeconomic History  . Marital status: Married    Spouse name: Not on file  . Number of children: Not on file  . Years of education: Not on file  . Highest education level: Not on file  Occupational History  . Not on file  Social Needs  . Financial resource strain: Not on file  . Food insecurity:    Worry: Not on file    Inability: Not on file  . Transportation needs:    Medical: Not on file    Non-medical: Not on file  Tobacco Use  . Smoking status: Never Smoker  . Smokeless tobacco: Never Used  Substance and Sexual Activity  . Alcohol use: No  . Drug use: No  . Sexual activity: Yes    Birth control/protection: Surgical  Lifestyle  . Physical activity:    Days per week: Not on file    Minutes per session: Not on file  . Stress: Not on file  Relationships  . Social connections:    Talks on phone: Not on file    Gets together: Not on file    Attends religious service: Not on  file    Active member of club or organization: Not on file    Attends meetings of clubs or organizations: Not on file    Relationship status: Not on file  . Intimate partner violence:    Fear of current or ex partner: Not on file    Emotionally abused: Not on file    Physically abused: Not on file    Forced sexual activity: Not on file  Other Topics Concern  . Not on file  Social History Narrative  . Not on file    FAMILY HISTORY: Family History  Problem Relation Age of Onset  . Anemia Mother   . Anemia Maternal Grandmother     ALLERGIES:  has No Known Allergies.  MEDICATIONS:    Current Outpatient Medications  Medication Sig Dispense Refill  . ferrous sulfate 325 (65 FE) MG tablet Take 325 mg by mouth daily with breakfast.    . phentermine (ADIPEX-P) 37.5 MG tablet Take 37.5 mg by mouth daily.  0  . Vitamin D, Ergocalciferol, (DRISDOL) 50000 units CAPS capsule Take 1 capsule by mouth once a week.    . valACYclovir (VALTREX) 500 MG tablet TAKE 1 TABLET BY MOUTH TWICE A DAY FOR 5 DAYS THEN TAKE 1 TABLET EVERY DAY THEREAFTER (Patient not taking: Reported on 02/27/2018) 90 tablet 2   No current facility-administered medications for this visit.     REVIEW OF SYSTEMS:   Constitutional: Denies fevers, chills or abnormal night sweats (+) mild fatigue  Eyes: Denies blurriness of vision, double vision or watery eyes Ears, nose, mouth, throat, and face: Denies mucositis or sore throat (+) seasonal allergies  Respiratory: Denies cough, dyspnea or wheezes Cardiovascular: Denies palpitation, chest discomfort or lower extremity swelling Gastrointestinal:  Denies nausea, heartburn or change in bowel habits Skin: Denies abnormal skin rashes Lymphatics: Denies new lymphadenopathy or easy bruising Neurological:Denies numbness, tingling or new weaknesses Behavioral/Psych: Mood is stable, no new changes  All other systems were reviewed with the patient and are negative.  PHYSICAL EXAMINATION:  Vitals:   02/27/18 1425  BP: 132/81  Pulse: 93  Resp: 18  Temp: 97.8 F (36.6 C)  SpO2: 100%   Filed Weights   02/27/18 1425  Weight: 206 lb 6.4 oz (93.6 kg)    GENERAL:alert, no distress and comfortable SKIN: skin color, texture, turgor are normal, no rashes or significant lesions EYES: normal, conjunctiva are pink and non-injected, sclera clear OROPHARYNX:no exudate, no erythema and lips, buccal mucosa, and tongue normal  NECK: supple, thyroid normal size, non-tender, without nodularity LYMPH:  no palpable lymphadenopathy in the cervical, axillary or inguinal LUNGS: clear to  auscultation and percussion with normal breathing effort HEART: regular rate & rhythm and no murmurs and no lower extremity edema ABDOMEN:abdomen soft, non-tender and normal bowel sounds Musculoskeletal:no cyanosis of digits and no clubbing  PSYCH: alert & oriented x 3 with fluent speech NEURO: no focal motor/sensory deficits  LABORATORY DATA:  I have reviewed the data as listed CBC Latest Ref Rng & Units 12/18/2013 11/16/2012 09/25/2012  WBC 4.0 - 10.5 K/uL 3.5(L) 5.8 7.6  Hemoglobin 12.0 - 15.0 g/dL 11.1(L) 10.6(L) 9.5(L)  Hematocrit 36.0 - 46.0 % 32.5(L) 31.3(L) 28.3(L)  Platelets 150 - 400 K/uL 257 260 195    CMP Latest Ref Rng & Units 12/18/2013 04/21/2012 07/24/2010  Glucose 70 - 99 mg/dL 99 85 94  BUN 6 - 23 mg/dL 8 7 7   Creatinine 0.50 - 1.10 mg/dL 0.80 0.66 0.77  Sodium 137 - 147  mEq/L 139 133(L) 137  Potassium 3.7 - 5.3 mEq/L 3.6(L) 3.8 3.4(L)  Chloride 96 - 112 mEq/L 104 102 105  CO2 19 - 32 mEq/L 23 23 28   Calcium 8.4 - 10.5 mg/dL 9.4 9.1 8.9  Total Protein 6.0 - 8.3 g/dL 7.8 6.6 6.7  Total Bilirubin 0.3 - 1.2 mg/dL 0.5 0.2(L) 0.5  Alkaline Phos 39 - 117 U/L 49 37(L) 37(L)  AST 0 - 37 U/L 18 16 13   ALT 0 - 35 U/L 14 20 9    I have reviewed her outside lab results   RADIOGRAPHIC STUDIES: I have personally reviewed the radiological images as listed and agreed with the findings in the report. No results found.  ASSESSMENT: Moe Brier is a 32 year old female with no pertinent past medical history who states she has had Anemia for most of her life.  1. Iron Deficient Anemia  -She has had a mild microcytic anemia since 2011, with hemoglobin in 9-11.5 range. She did have a normal CBC with normal MCV in 2009  -I reviewed her outside lab from her PCP. was found to have abnormal CBC from Feb and March 2019 with Hgb of 9.2 and 9.9, low ferritin, low HCT, low MCV, low MCH, and high RDW, which is consistent with iron deficient anemia. -She does not have heavy menses or overt GI  bleeding that would cause her to lose blood. This is possible likely iron deficient anemia secondary to absorbsion. -I will check a celiac panel -She takes OTC ferrous sulfate about 3 times a week and she tolerates the tablet well with no constipation. I advised her to take every day up to TID with orange juice to increase her absorption.  -I discussed her treatment options if increasing her oral iron supplement or iv iron does not work, I will obtain other tests to rule out other types of anemia.  She did have normal CBC with normal MCV before, this is unlikely thalassemia. -She will return in 2 months for f/u and lab to see if increased oral iron has improved her anemia, if not we will discuss IV iron at that time  PLAN:  -She can increase OTC ferrous sulfate to twice or 3 times a day, constipation reviewed. -She will repeat labs in 1 months  -she will return in 2 months for f/u and lab with iron studies to see if increased oral iron has improved her anemia, if not we will discuss IV iron at that time  No problem-specific Assessment & Plan notes found for this encounter.  All questions were answered. The patient knows to call the clinic with any problems, questions or concerns. I spent 20 minutes counseling the patient face to face. The total time spent in the appointment was 25 minutes and more than 50% was on counseling.   This document serves as a record of services personally performed by Truitt Merle, MD. It was created on her behalf by Theresia Bough, a trained medical scribe. The creation of this record is based on the scribe's personal observations and the provider's statements to them.   I have reviewed the above documentation for accuracy and completeness, and I agree with the above.    Truitt Merle, MD 02/27/18 4:53 PM

## 2018-02-27 ENCOUNTER — Inpatient Hospital Stay: Payer: 59 | Attending: Hematology | Admitting: Hematology

## 2018-02-27 ENCOUNTER — Telehealth: Payer: Self-pay | Admitting: Hematology

## 2018-02-27 ENCOUNTER — Encounter: Payer: Self-pay | Admitting: Hematology

## 2018-02-27 DIAGNOSIS — D509 Iron deficiency anemia, unspecified: Secondary | ICD-10-CM

## 2018-02-27 DIAGNOSIS — Z79899 Other long term (current) drug therapy: Secondary | ICD-10-CM | POA: Diagnosis not present

## 2018-02-27 NOTE — Telephone Encounter (Signed)
Scheduled appt per 4/19 los - Gave patient aVS and calender per los.

## 2018-03-30 ENCOUNTER — Other Ambulatory Visit: Payer: 59

## 2018-03-31 ENCOUNTER — Inpatient Hospital Stay: Payer: 59

## 2018-04-28 ENCOUNTER — Telehealth: Payer: Self-pay | Admitting: Hematology

## 2018-04-28 NOTE — Telephone Encounter (Signed)
Patient called to cancel °

## 2018-04-30 ENCOUNTER — Other Ambulatory Visit: Payer: 59

## 2018-04-30 ENCOUNTER — Inpatient Hospital Stay: Payer: 59 | Admitting: Hematology

## 2018-04-30 ENCOUNTER — Inpatient Hospital Stay: Payer: 59

## 2018-04-30 ENCOUNTER — Ambulatory Visit: Payer: 59 | Admitting: Hematology

## 2018-06-02 ENCOUNTER — Other Ambulatory Visit: Payer: 59

## 2018-06-02 ENCOUNTER — Ambulatory Visit: Payer: 59 | Admitting: Hematology

## 2018-11-13 DIAGNOSIS — F411 Generalized anxiety disorder: Secondary | ICD-10-CM | POA: Diagnosis not present

## 2018-12-08 DIAGNOSIS — F411 Generalized anxiety disorder: Secondary | ICD-10-CM | POA: Diagnosis not present

## 2019-01-11 DIAGNOSIS — Z131 Encounter for screening for diabetes mellitus: Secondary | ICD-10-CM | POA: Diagnosis not present

## 2019-01-11 DIAGNOSIS — Z13220 Encounter for screening for lipoid disorders: Secondary | ICD-10-CM | POA: Diagnosis not present

## 2019-01-11 DIAGNOSIS — Z1329 Encounter for screening for other suspected endocrine disorder: Secondary | ICD-10-CM | POA: Diagnosis not present

## 2019-01-11 DIAGNOSIS — Z114 Encounter for screening for human immunodeficiency virus [HIV]: Secondary | ICD-10-CM | POA: Diagnosis not present

## 2019-01-11 DIAGNOSIS — Z1159 Encounter for screening for other viral diseases: Secondary | ICD-10-CM | POA: Diagnosis not present

## 2019-01-11 DIAGNOSIS — Z0000 Encounter for general adult medical examination without abnormal findings: Secondary | ICD-10-CM | POA: Diagnosis not present

## 2019-01-11 DIAGNOSIS — Z1151 Encounter for screening for human papillomavirus (HPV): Secondary | ICD-10-CM | POA: Diagnosis not present

## 2019-01-11 DIAGNOSIS — R87810 Cervical high risk human papillomavirus (HPV) DNA test positive: Secondary | ICD-10-CM | POA: Diagnosis not present

## 2019-01-11 DIAGNOSIS — Z113 Encounter for screening for infections with a predominantly sexual mode of transmission: Secondary | ICD-10-CM | POA: Diagnosis not present

## 2019-01-11 DIAGNOSIS — Z01419 Encounter for gynecological examination (general) (routine) without abnormal findings: Secondary | ICD-10-CM | POA: Diagnosis not present

## 2019-01-11 DIAGNOSIS — D649 Anemia, unspecified: Secondary | ICD-10-CM | POA: Diagnosis not present

## 2019-01-11 DIAGNOSIS — Z118 Encounter for screening for other infectious and parasitic diseases: Secondary | ICD-10-CM | POA: Diagnosis not present

## 2019-01-11 DIAGNOSIS — Z6831 Body mass index (BMI) 31.0-31.9, adult: Secondary | ICD-10-CM | POA: Diagnosis not present

## 2019-03-18 DIAGNOSIS — M542 Cervicalgia: Secondary | ICD-10-CM | POA: Diagnosis not present

## 2019-09-30 DIAGNOSIS — J019 Acute sinusitis, unspecified: Secondary | ICD-10-CM | POA: Diagnosis not present

## 2019-10-14 DIAGNOSIS — H04123 Dry eye syndrome of bilateral lacrimal glands: Secondary | ICD-10-CM | POA: Diagnosis not present

## 2019-10-14 DIAGNOSIS — B0059 Other herpesviral disease of eye: Secondary | ICD-10-CM | POA: Diagnosis not present

## 2019-10-14 MED FILL — VALACYCLOVIR HCL 500 MG TAB: 500 | 10 days supply | Qty: 30 | Fill #0

## 2019-10-27 DIAGNOSIS — B0059 Other herpesviral disease of eye: Secondary | ICD-10-CM | POA: Diagnosis not present

## 2019-10-27 DIAGNOSIS — H04123 Dry eye syndrome of bilateral lacrimal glands: Secondary | ICD-10-CM | POA: Diagnosis not present

## 2020-01-17 DIAGNOSIS — Z13220 Encounter for screening for lipoid disorders: Secondary | ICD-10-CM | POA: Diagnosis not present

## 2020-01-17 DIAGNOSIS — D649 Anemia, unspecified: Secondary | ICD-10-CM | POA: Diagnosis not present

## 2020-01-17 DIAGNOSIS — Z1329 Encounter for screening for other suspected endocrine disorder: Secondary | ICD-10-CM | POA: Diagnosis not present

## 2020-01-17 DIAGNOSIS — Z0000 Encounter for general adult medical examination without abnormal findings: Secondary | ICD-10-CM | POA: Diagnosis not present

## 2020-01-17 DIAGNOSIS — R87810 Cervical high risk human papillomavirus (HPV) DNA test positive: Secondary | ICD-10-CM | POA: Diagnosis not present

## 2020-01-17 DIAGNOSIS — Z1151 Encounter for screening for human papillomavirus (HPV): Secondary | ICD-10-CM | POA: Diagnosis not present

## 2020-01-17 DIAGNOSIS — Z01419 Encounter for gynecological examination (general) (routine) without abnormal findings: Secondary | ICD-10-CM | POA: Diagnosis not present

## 2020-01-17 DIAGNOSIS — Z130 Encounter for screening for diseases of the blood and blood-forming organs and certain disorders involving the immune mechanism: Secondary | ICD-10-CM | POA: Diagnosis not present

## 2020-01-17 MED FILL — VALACYCLOVIR HCL 500 MG TAB: 500 | 15 days supply | Qty: 30 | Fill #0

## 2020-01-17 MED FILL — FOLIC ACID 1 MG TABS: 1 | 90 days supply | Qty: 90 | Fill #0

## 2020-01-20 MED FILL — FERRAPLUS 90 TABLET: 90-1 | 30 days supply | Qty: 30 | Fill #0

## 2020-02-15 ENCOUNTER — Telehealth: Payer: 59 | Admitting: Physician Assistant

## 2020-02-15 DIAGNOSIS — M545 Low back pain, unspecified: Secondary | ICD-10-CM

## 2020-02-15 NOTE — Progress Notes (Signed)
Hi Demira,  I am sorry you are not feeling well. I am concerned about your back pain. There are many things that can cause low back pain in females. I would feel more comfortable if you were evaluated in person by a medical professional.   Based on what you shared with me, I feel your condition warrants further evaluation and I recommend that you be seen for a face to face office visit.   NOTE: If you entered your credit card information for this eVisit, you will not be charged. You may see a "hold" on your card for the $35 but that hold will drop off and you will not have a charge processed.   If you are having a true medical emergency please call 911.      For an urgent face to face visit, [facility] has five urgent care centers for your convenience:      NEW:  [facility] [place] at [place] Get Driving Directions [id number] [place], [place] [address] . 10 am - 6pm Monday - Friday    [place] [facility]) Get Driving Directions [id number] [address] [place], [place] [address] . 10 am to 8 pm Monday-Friday . 12 pm to 8 pm [facility] Urgent Care at [facility] Get Driving Directions [id number] [place], [place], [place] [address] . 8 am to 8 pm Monday-Friday . 9 am to 6 pm Saturday . 11 am to 6 pm Sunday     [facility] at [facility] Get Driving Directions  [id number] [address] [place], [place] [address] . 8 am to 8 pm Monday-Friday . 8 am to 4 pm [facility] Urgent Care at [place] Get Driving Directions [id number] [address] [place], [place] [address] . 12 pm to 6 pm Monday-Friday      Your e-visit answers were reviewed by a board certified advanced clinical practitioner to complete your personal care plan.  Thank you for using e-Visits.

## 2020-02-17 DIAGNOSIS — L989 Disorder of the skin and subcutaneous tissue, unspecified: Secondary | ICD-10-CM | POA: Diagnosis not present

## 2020-03-17 ENCOUNTER — Telehealth: Payer: 59 | Admitting: Family

## 2020-03-17 DIAGNOSIS — R399 Unspecified symptoms and signs involving the genitourinary system: Secondary | ICD-10-CM

## 2020-03-17 MED ORDER — CEPHALEXIN 500 MG PO CAPS: 500.0000 mg | ORAL_CAPSULE | Freq: Two times a day (BID) | ORAL | 0 refills | Status: DC

## 2020-03-17 NOTE — Progress Notes (Signed)

## 2020-04-05 ENCOUNTER — Telehealth: Payer: 59 | Admitting: Family

## 2020-04-05 DIAGNOSIS — N3000 Acute cystitis without hematuria: Secondary | ICD-10-CM | POA: Diagnosis not present

## 2020-04-05 DIAGNOSIS — N898 Other specified noninflammatory disorders of vagina: Secondary | ICD-10-CM

## 2020-04-05 DIAGNOSIS — R300 Dysuria: Secondary | ICD-10-CM

## 2020-04-05 NOTE — Progress Notes (Signed)
Based on what you shared with me, I feel your condition warrants further evaluation and I recommend that you be seen for a face to face office visit.  Given you are still having UTI symptoms that did not improve with antibiotics, you need to be seen face to face for a urine culture and to rule out other serious infections.    NOTE: If you entered your credit card information for this eVisit, you will not be charged. You may see a "hold" on your card for the $35 but that hold will drop off and you will not have a charge processed.   If you are having a true medical emergency please call 911.      For an urgent face to face visit, [facility] has five urgent care centers for your convenience:      NEW:  [facility]gent [place] at [place] Get Driving Directions [id number] [address], [place] [address] . 10 am - 6pm Monday - Friday    [place] Urgent [place] [facility]) Get Driving Directions [id number] [address] [place], [place] [address] . 10 am to 8 pm Monday-Friday . 12 pm to 8 pm [facility]gent Care at [facility] Get Driving Directions [id number] [address], [place], [place] [address] . 8 am to 8 pm Monday-Friday . 9 am to 6 pm Saturday . 11 am to 6 pm Sunday     [facility] at [facility] Get Driving Directions  [id number] [address] [place], [place] [address] . 8 am to 8 pm Monday-Friday . 8 am to 4 pm [facility] Urgent Care at [place] Get Driving Directions [id number] [address] [place], [place] [address] . 12 pm to 6 pm Monday-Friday      Your e-visit answers were reviewed by a board certified advanced clinical practitioner to complete your personal care plan.  Thank you for using e-Visits.

## 2020-04-17 DIAGNOSIS — Z131 Encounter for screening for diabetes mellitus: Secondary | ICD-10-CM | POA: Diagnosis not present

## 2020-04-17 DIAGNOSIS — D509 Iron deficiency anemia, unspecified: Secondary | ICD-10-CM | POA: Diagnosis not present

## 2020-04-17 DIAGNOSIS — E559 Vitamin D deficiency, unspecified: Secondary | ICD-10-CM | POA: Diagnosis not present

## 2020-04-17 DIAGNOSIS — N926 Irregular menstruation, unspecified: Secondary | ICD-10-CM | POA: Diagnosis not present

## 2020-04-17 DIAGNOSIS — R8290 Unspecified abnormal findings in urine: Secondary | ICD-10-CM | POA: Diagnosis not present

## 2020-04-18 ENCOUNTER — Other Ambulatory Visit: Payer: Self-pay | Admitting: Obstetrics & Gynecology

## 2020-04-18 ENCOUNTER — Telehealth: Payer: Self-pay

## 2020-04-18 NOTE — Telephone Encounter (Signed)
Donna Conley called stating she had labs at the PCP and her iron was low and she wants to schedule an iron infusion. PCP faxed results.  Pt has not been seen since 2019 for consult.

## 2020-04-19 ENCOUNTER — Other Ambulatory Visit: Payer: Self-pay | Admitting: Hematology

## 2020-04-19 ENCOUNTER — Telehealth: Payer: Self-pay | Admitting: Hematology

## 2020-04-19 ENCOUNTER — Other Ambulatory Visit: Payer: Self-pay

## 2020-04-19 NOTE — Progress Notes (Signed)
error 

## 2020-04-19 NOTE — Telephone Encounter (Signed)
Scheduled appt per 6/9 sch message - pt is aware of appts .

## 2020-04-24 ENCOUNTER — Inpatient Hospital Stay (HOSPITAL_BASED_OUTPATIENT_CLINIC_OR_DEPARTMENT_OTHER): Payer: 59 | Admitting: Medical

## 2020-04-24 ENCOUNTER — Inpatient Hospital Stay (HOSPITAL_BASED_OUTPATIENT_CLINIC_OR_DEPARTMENT_OTHER): Payer: 59 | Admitting: Hematology

## 2020-04-24 ENCOUNTER — Other Ambulatory Visit: Payer: Self-pay

## 2020-04-24 ENCOUNTER — Emergency Department (HOSPITAL_COMMUNITY): Admission: EM | Admit: 2020-04-24 | Discharge: 2020-04-24 | Discharge status: Against Medical Advice | Payer: 59

## 2020-04-24 ENCOUNTER — Encounter: Payer: Self-pay | Admitting: Hematology

## 2020-04-24 ENCOUNTER — Inpatient Hospital Stay: Payer: 59 | Attending: Hematology

## 2020-04-24 VITALS — BP 137/95 | HR 90 | Temp 98.5°F | Resp 18

## 2020-04-24 VITALS — BP 120/78 | HR 87 | Temp 97.8°F | Resp 18 | Ht 64.0 in | Wt 222.3 lb

## 2020-04-24 DIAGNOSIS — N920 Excessive and frequent menstruation with regular cycle: Secondary | ICD-10-CM | POA: Insufficient documentation

## 2020-04-24 DIAGNOSIS — I10 Essential (primary) hypertension: Secondary | ICD-10-CM | POA: Diagnosis not present

## 2020-04-24 DIAGNOSIS — R0602 Shortness of breath: Secondary | ICD-10-CM

## 2020-04-24 DIAGNOSIS — T8090XS Unspecified complication following infusion and therapeutic injection, sequela: Secondary | ICD-10-CM

## 2020-04-24 DIAGNOSIS — D500 Iron deficiency anemia secondary to blood loss (chronic): Secondary | ICD-10-CM

## 2020-04-24 DIAGNOSIS — R079 Chest pain, unspecified: Secondary | ICD-10-CM | POA: Diagnosis not present

## 2020-04-24 DIAGNOSIS — R0789 Other chest pain: Secondary | ICD-10-CM | POA: Diagnosis not present

## 2020-04-24 DIAGNOSIS — R109 Unspecified abdominal pain: Secondary | ICD-10-CM

## 2020-04-24 DIAGNOSIS — R202 Paresthesia of skin: Secondary | ICD-10-CM | POA: Diagnosis not present

## 2020-04-24 DIAGNOSIS — D509 Iron deficiency anemia, unspecified: Secondary | ICD-10-CM | POA: Insufficient documentation

## 2020-04-24 DIAGNOSIS — T7840XA Allergy, unspecified, initial encounter: Secondary | ICD-10-CM | POA: Diagnosis not present

## 2020-04-24 DIAGNOSIS — Z79899 Other long term (current) drug therapy: Secondary | ICD-10-CM | POA: Insufficient documentation

## 2020-04-24 MED ORDER — ALBUTEROL SULFATE (2.5 MG/3ML) 0.083% IN NEBU
2.5000 mg | INHALATION_SOLUTION | Freq: Once | RESPIRATORY_TRACT | Status: AC | PRN
  Administered 2020-04-24: 2.5 mg via RESPIRATORY_TRACT
  Filled 2020-04-24: qty 3

## 2020-04-24 MED ORDER — DIPHENHYDRAMINE HCL 50 MG/ML IJ SOLN
25.0000 mg | Freq: Once | INTRAMUSCULAR | Status: AC
  Administered 2020-04-24: 25 mg via INTRAVENOUS

## 2020-04-24 MED ORDER — SODIUM CHLORIDE 0.9 % IV SOLN
510.0000 mg | Freq: Once | INTRAVENOUS | Status: AC
  Administered 2020-04-24: 510 mg via INTRAVENOUS
  Filled 2020-04-24: qty 510

## 2020-04-24 MED ORDER — METHYLPREDNISOLONE SODIUM SUCC 125 MG IJ SOLR
125.0000 mg | Freq: Once | INTRAMUSCULAR | Status: AC | PRN
  Administered 2020-04-24: 125 mg via INTRAVENOUS

## 2020-04-24 MED ORDER — FAMOTIDINE IN NACL 20-0.9 MG/50ML-% IV SOLN
20.0000 mg | Freq: Once | INTRAVENOUS | Status: AC
  Administered 2020-04-24: 20 mg via INTRAVENOUS

## 2020-04-24 MED ORDER — FAMOTIDINE IN NACL 20-0.9 MG/50ML-% IV SOLN
INTRAVENOUS | Status: AC
  Filled 2020-04-24: qty 50

## 2020-04-24 MED ORDER — SODIUM CHLORIDE 0.9 % IV SOLN
Freq: Once | INTRAVENOUS | Status: DC | PRN
  Filled 2020-04-24: qty 250

## 2020-04-24 MED ORDER — LORATADINE 10 MG PO TABS
ORAL_TABLET | ORAL | Status: AC
  Filled 2020-04-24: qty 1

## 2020-04-24 MED ORDER — SODIUM CHLORIDE 0.9 % IV SOLN
Freq: Once | INTRAVENOUS | Status: AC
  Filled 2020-04-24: qty 250

## 2020-04-24 MED ORDER — LORATADINE 10 MG PO TABS
10.0000 mg | ORAL_TABLET | Freq: Once | ORAL | Status: AC
  Administered 2020-04-24: 10 mg via ORAL

## 2020-04-24 NOTE — Patient Instructions (Signed)

## 2020-04-24 NOTE — Progress Notes (Signed)
1035: Pt c/o feeling nauseated, quickly followed by shortness of breath. Feraheme paused, Sandi Mealy, PA notified. Normal saline started. 2L O2 applied via [place]. Sandi Mealy, PA present at bedside. Albuterol, solu-medrol, pepcid and benadryl given (see eMAR). See flowsheets for vital signs. Pt noted improvement in symptoms after pepcid was given at 1055. Per MD Burr Medico, ok to d/c patient home. Per patient, husband will drive her home. Pt verbalizes understanding to take Benadryl at home if symptoms persist per MD Burr Medico. Pt will call [facility] [facility] with further questions/concerns.

## 2020-04-24 NOTE — Progress Notes (Addendum)
[place]   Telephone:(336) [phone] Fax:(336) [phone]   Clinic Follow up Note   Patient Care Team: Antony Contras, MD as PCP - General (Family Medicine) Truitt Merle, MD as Consulting Physician (Hematology)  Date of Service:  04/24/2020  CHIEF COMPLAINT: Iron deficient Anemia   CURRENT THERAPY:  Oral Iron   INTERVAL HISTORY:  Donna Conley is here for a follow up of IDA. She was last seen by me in 02/2018. She presents to the clinic alone. She notes she did not return after 2019 because she did not want to have any outstanding bills. She notes she has labs with Dr Moreen Fowler her PCP last week who she says faxed labs results.  She thinks she has heavy menses that last 4-5 days. She notes lately she has been significantly fatigued even if she sleeps. She notes this may have some impact on her work. She notes she lives in [place] but works at [employer] PCP in [place].    REVIEW OF SYSTEMS:   Constitutional: Denies fevers, chills or abnormal weight loss (+) Fatigue  Eyes: Denies blurriness of vision Ears, nose, mouth, throat, and face: Denies mucositis or sore throat Respiratory: Denies cough, dyspnea or wheezes Cardiovascular: Denies palpitation, chest discomfort or lower extremity swelling Gastrointestinal:  Denies nausea, heartburn or change in bowel habits Skin: Denies abnormal skin rashes Lymphatics: Denies new lymphadenopathy or easy bruising Neurological:Denies numbness, tingling or new weaknesses Behavioral/Psych: Mood is stable, no new changes  All other systems were reviewed with the patient and are negative.  MEDICAL HISTORY:  Past Medical History:  Diagnosis Date  . Urinary tract infection     SURGICAL HISTORY: Past Surgical History:  Procedure Laterality Date  . CESAREAN SECTION    . CESAREAN SECTION  2009   General Anesthesia  . CESAREAN SECTION  09/24/2012   Procedure: CESAREAN SECTION;  Surgeon: Shelly Bombard, MD;  Location: [facility];   Service: Obstetrics;  Laterality: N/A;  . LAPAROSCOPIC TUBAL LIGATION  11/20/2012   Procedure: LAPAROSCOPIC TUBAL LIGATION;  Surgeon: Lahoma Crocker, MD;  Location: [facility];  Service: Gynecology;  Laterality: Bilateral;    I have reviewed the social history and family history with the patient and they are unchanged from previous note.  ALLERGIES:  has No Known Allergies.  MEDICATIONS:  Current Outpatient Medications  Medication Sig Dispense Refill  . cephALEXin (KEFLEX) 500 MG capsule Take 1 capsule (500 mg total) by mouth 2 (two) times daily. 14 capsule 0  . ferrous sulfate 325 (65 FE) MG tablet Take 325 mg by mouth daily with breakfast.    . phentermine (ADIPEX-P) 37.5 MG tablet Take 37.5 mg by mouth daily.  0  . valACYclovir (VALTREX) 500 MG tablet TAKE 1 TABLET BY MOUTH TWICE A DAY FOR 5 DAYS THEN TAKE 1 TABLET EVERY DAY THEREAFTER (Patient not taking: Reported on 02/27/2018) 90 tablet 2  . Vitamin D, Ergocalciferol, (DRISDOL) 50000 units CAPS capsule Take 1 capsule by mouth once a week.     No current facility-administered medications for this visit.   Facility-Administered Medications Ordered in Other Visits  Medication Dose Route Frequency Provider Last Rate Last Admin  . famotidine (PEPCID) IVPB 20 mg premix  20 mg Intravenous Once Truitt Merle, MD 200 mL/hr at 04/24/20 0942 20 mg at 04/24/20 0942  . ferumoxytol (FERAHEME) 510 mg in sodium chloride 0.9 % 100 mL IVPB  510 mg Intravenous Once Truitt Merle, MD        PHYSICAL EXAMINATION: ECOG PERFORMANCE  STATUS: 1 - Symptomatic but completely ambulatory  Vitals:   04/24/20 0842  BP: 120/78  Pulse: 87  Resp: 18  Temp: 97.8 F (36.6 C)  SpO2: 100%   Filed Weights   04/24/20 0842  Weight: 222 lb 4.8 oz (100.8 kg)    Due to COVID19 we will limit examination to appearance. Patient had no complaints.  GENERAL:alert, no distress and comfortable SKIN: skin color normal, no rashes or significant lesions EYES: normal, Conjunctiva  are pink and non-injected, sclera clear  NEURO: alert & oriented x 3 with fluent speech   LABORATORY DATA:  I have reviewed the data as listed CBC Latest Ref Rng & Units 12/18/2013 11/16/2012 09/25/2012  WBC 4.0 - 10.5 K/uL 3.5(L) 5.8 7.6  Hemoglobin 12.0 - 15.0 g/dL 11.1(L) 10.6(L) 9.5(L)  Hematocrit 36 - 46 % 32.5(L) 31.3(L) 28.3(L)  Platelets 150 - 400 K/uL 257 260 195     CMP Latest Ref Rng & Units 12/18/2013 04/21/2012 07/24/2010  Glucose 70 - 99 mg/dL 99 85 94  BUN 6 - 23 mg/dL 8 7 7   Creatinine 0.50 - 1.10 mg/dL 0.80 0.66 0.77  Sodium 137 - 147 mEq/L 139 133(L) 137  Potassium 3.7 - 5.3 mEq/L 3.6(L) 3.8 3.4(L)  Chloride 96 - 112 mEq/L 104 102 105  CO2 19 - 32 mEq/L 23 23 28   Calcium 8.4 - 10.5 mg/dL 9.4 9.1 8.9  Total Protein 6.0 - 8.3 g/dL 7.8 6.6 6.7  Total Bilirubin 0.3 - 1.2 mg/dL 0.5 0.2(L) 0.5  Alkaline Phos 39 - 117 U/L 49 37(L) 37(L)  AST 0 - 37 U/L 18 16 13   ALT 0 - 35 U/L 14 20 9       RADIOGRAPHIC STUDIES: I have personally reviewed the radiological images as listed and agreed with the findings in the report. No results found.   ASSESSMENT & PLAN:  Donna Conley is a 34 y.o. female with    1. Iron Deficient Anemia  -She has had a mild microcytic anemia since 2011, with hemoglobin in 9-11.5 range. She did have a normal CBC with normal MCV in 2009  -I reviewed her initial outside lab from her PCP was found to have abnormal CBC from Feb and March 2019 with Hgb of 9.2 and 9.9, low ferritin, low HCT, low MCV, low MCH, and high RDW, which is consistent with iron deficient anemia. She does not have overt GI bleeding that would cause her to lose blood.  -She is currently on Oral ferrous sulfate 325mg  once daily.  -She lost f/u after 02/2018 to complete paying off outstanding bills before return.  -Recently she has been more fatigued. She notes she does have heavy menses. I discussed her iron deficiency is likely related to blood loss from menorrhagia.  -Her labs from  04/17/20 with her PCP show ferritin 5, low Vit D, WBC 5.4, Hg 10, Hct 30.6%, MCV 75.3, plt 335K.  -I discussed treatment with IV Feraheme for more direct intervention. I reviewed side effects of allergy reaction. She is agreeable to proceed with infusion today.  -Continue oral iron. I discussed if she does not respond to IV iron I will do more work up for other etiologies.  -Monitor with labs every 2 months. F/u in 6 months with next IV Feraheme    PLAN:  -IV Feraheme today and in 2 weeks  -Continue oral iron  -Lab in 2, 4, 6  months at [facility] or here  -F/u and IV Feraheme in 6 months,  a few days after lab    No problem-specific Assessment & Plan notes found for this encounter.   No orders of the defined types were placed in this encounter.  All questions were answered. The patient knows to call the clinic with any problems, questions or concerns. No barriers to learning was detected. The total time spent in the appointment was 25 minutes.     Truitt Merle, MD 04/24/2020   I, Joslyn Devon, am acting as scribe for Truitt Merle, MD.   I have reviewed the above documentation for accuracy and completeness, and I agree with the above.    Addendum  Pt had a reaction to IV Feraheme.  She developed shortness of breath, chest and throat tightness, 45 minutes after Feraheme infusion started.  Her vital signs were stable except mild hypertension.  No chest pain or dizziness.  Infusion was stopped, patient was given Solu-Medrol, Benadryl and Pepcid.  She was seen by symptom management clinic PA Lucianne Lei first.  I saw her in the infusion room afterwards, her symptoms were nearly resolved when I saw her.  Repeat vital signs were normal.  Due to her moderate reaction, I would not rechallenge her with Feraheme.  We discussed other IV iron alternatives, and I recommend her to try IV Venofer 200 mg, with slow infusion and premeds. She is agreeable, will start in 2 weeks.  Truitt Merle

## 2020-04-25 ENCOUNTER — Telehealth: Payer: Self-pay | Admitting: Hematology

## 2020-04-25 NOTE — Telephone Encounter (Signed)
Called pt to scheduled appts per 6/14 los.  Pt stated when she come in on 6/28 she will stop by scheduling to get her appts scheduled because she will know her work schedule by then.  No appts scheduled at the moment per pt request.

## 2020-04-25 NOTE — Progress Notes (Signed)
DATE:  04/24/2020                                          X    INFUSION REACTION    MD:  Dr. Truitt Merle   AGENT/BLOOD PRODUCT RECEIVING TODAY:              Feraheme   AGENT/BLOOD PRODUCT RECEIVING IMMEDIATELY PRIOR TO REACTION:          Feraheme   VS: BP:     159/111   P:       83       SPO2:       99% on room air                BP:     140/99   P:       81       SPO2:       100% on oxygen at 2 LPM via [place]   BP:     130/94   P:       90       SPO2:       100% on oxygen at 2 LPM via [place]     REACTION(S):           Chest tightness, shortness of breath, and stomach pain.   PREMEDS:     Pepcid 20 mg IV x 1 and Claritan 10 mg PO x 1   INTERVENTION: Solumedrol 60 mg IV x 2, Albuterol inhaler 2 puffs, Benadryl 25 mg IV x 1   Review of Systems  Review of Systems  Constitutional: Negative for chills, diaphoresis and fever.  HENT: Negative for trouble swallowing and voice change.   Respiratory: Positive for chest tightness and shortness of breath. Negative for cough and wheezing.   Cardiovascular: Negative for chest pain and palpitations.  Gastrointestinal: Positive for abdominal pain. Negative for constipation, diarrhea, nausea and vomiting.  Musculoskeletal: Negative for back pain and myalgias.  Neurological: Negative for dizziness, light-headedness and headaches.     Physical Exam  Physical Exam Constitutional:      General: She is not in acute distress.    Appearance: She is not diaphoretic.  HENT:     Head: Normocephalic and atraumatic.  Cardiovascular:     Rate and Rhythm: Normal rate and regular rhythm.     Heart sounds: Normal heart sounds. No murmur heard.  No friction rub. No gallop.   Pulmonary:     Effort: Pulmonary effort is normal. No respiratory distress.     Breath sounds: Examination of the right-upper field reveals decreased breath sounds. Examination of the left-upper field reveals decreased breath sounds. Examination of the right-middle field reveals decreased  breath sounds. Examination of the left-middle field reveals decreased breath sounds. Examination of the right-lower field reveals decreased breath sounds. Examination of the left-lower field reveals decreased breath sounds. Decreased breath sounds present. No wheezing or rales.  Skin:    General: Skin is warm and dry.     Findings: No erythema or rash.  Neurological:     Mental Status: She is alert.     OUTCOME:                The patient was seen with Dr. Burr Medico. She was not rechallenged  with Feraheme. She was told to take Benadryl 25 mg PO x 1  when returning home. She was told to call to have someone drive her home as she had driven herself to today's appointment. She will return in 2 weeks with Dr. Burr Medico having a decision regarding future treatments.  Sandi Mealy, MHS, PA-C  This patient was seen with Dr. Burr Medico with my treatment plan reviewed with her. She expressed agreement with my medical management of this patient.

## 2020-04-25 NOTE — Addendum Note (Signed)
Addended by: Truitt Merle on: 04/25/2020 08:49 AM   Modules accepted: Orders

## 2020-05-08 ENCOUNTER — Other Ambulatory Visit: Payer: Self-pay

## 2020-05-08 ENCOUNTER — Inpatient Hospital Stay: Payer: 59

## 2020-05-08 VITALS — BP 132/74 | HR 98 | Temp 98.8°F | Resp 16

## 2020-05-08 DIAGNOSIS — Z79899 Other long term (current) drug therapy: Secondary | ICD-10-CM | POA: Diagnosis not present

## 2020-05-08 DIAGNOSIS — D509 Iron deficiency anemia, unspecified: Secondary | ICD-10-CM | POA: Diagnosis not present

## 2020-05-08 DIAGNOSIS — I10 Essential (primary) hypertension: Secondary | ICD-10-CM | POA: Diagnosis not present

## 2020-05-08 DIAGNOSIS — N920 Excessive and frequent menstruation with regular cycle: Secondary | ICD-10-CM | POA: Diagnosis not present

## 2020-05-08 MED ORDER — SODIUM CHLORIDE 0.9 % IV SOLN
Freq: Once | INTRAVENOUS | Status: AC
  Filled 2020-05-08: qty 250

## 2020-05-08 MED ORDER — FAMOTIDINE IN NACL 20-0.9 MG/50ML-% IV SOLN
INTRAVENOUS | Status: AC
  Filled 2020-05-08: qty 50

## 2020-05-08 MED ORDER — METHYLPREDNISOLONE SODIUM SUCC 125 MG IJ SOLR
INTRAMUSCULAR | Status: AC
  Filled 2020-05-08: qty 2

## 2020-05-08 MED ORDER — DIPHENHYDRAMINE HCL 50 MG/ML IJ SOLN
50.0000 mg | Freq: Once | INTRAMUSCULAR | Status: AC
  Administered 2020-05-08: 50 mg via INTRAVENOUS

## 2020-05-08 MED ORDER — FAMOTIDINE IN NACL 20-0.9 MG/50ML-% IV SOLN
20.0000 mg | Freq: Once | INTRAVENOUS | Status: AC
  Administered 2020-05-08: 20 mg via INTRAVENOUS

## 2020-05-08 MED ORDER — METHYLPREDNISOLONE SODIUM SUCC 125 MG IJ SOLR
125.0000 mg | Freq: Once | INTRAMUSCULAR | Status: AC
  Administered 2020-05-08: 125 mg via INTRAVENOUS

## 2020-05-08 MED ORDER — SODIUM CHLORIDE 0.9 % IV SOLN
300.0000 mg | Freq: Once | INTRAVENOUS | Status: AC
  Administered 2020-05-08: 300 mg via INTRAVENOUS
  Filled 2020-05-08: qty 15

## 2020-05-08 MED ORDER — DIPHENHYDRAMINE HCL 50 MG/ML IJ SOLN
INTRAMUSCULAR | Status: AC
  Filled 2020-05-08: qty 1

## 2020-05-08 NOTE — Patient Instructions (Signed)

## 2020-05-09 ENCOUNTER — Telehealth: Payer: Self-pay

## 2020-05-09 NOTE — Telephone Encounter (Signed)
I spoke with Donna Conley and let her know a copy of her FMLA papers were mailed to her and faxed to [employer].  She verbalized understanding.

## 2020-05-22 ENCOUNTER — Other Ambulatory Visit: Payer: Self-pay

## 2020-05-22 ENCOUNTER — Inpatient Hospital Stay: Payer: 59 | Attending: Hematology

## 2020-05-22 VITALS — BP 108/94 | HR 80 | Temp 98.8°F | Resp 16

## 2020-05-22 DIAGNOSIS — Z87440 Personal history of urinary (tract) infections: Secondary | ICD-10-CM | POA: Insufficient documentation

## 2020-05-22 DIAGNOSIS — Z79899 Other long term (current) drug therapy: Secondary | ICD-10-CM | POA: Diagnosis not present

## 2020-05-22 DIAGNOSIS — D509 Iron deficiency anemia, unspecified: Secondary | ICD-10-CM | POA: Insufficient documentation

## 2020-05-22 DIAGNOSIS — N920 Excessive and frequent menstruation with regular cycle: Secondary | ICD-10-CM | POA: Insufficient documentation

## 2020-05-22 DIAGNOSIS — R519 Headache, unspecified: Secondary | ICD-10-CM | POA: Insufficient documentation

## 2020-05-22 MED ORDER — SODIUM CHLORIDE 0.9 % IV SOLN
Freq: Once | INTRAVENOUS | Status: AC
  Filled 2020-05-22: qty 250

## 2020-05-22 MED ORDER — FAMOTIDINE IN NACL 20-0.9 MG/50ML-% IV SOLN
20.0000 mg | Freq: Once | INTRAVENOUS | Status: AC
  Administered 2020-05-22: 20 mg via INTRAVENOUS

## 2020-05-22 MED ORDER — METHYLPREDNISOLONE SODIUM SUCC 125 MG IJ SOLR
INTRAMUSCULAR | Status: AC
  Filled 2020-05-22: qty 2

## 2020-05-22 MED ORDER — SODIUM CHLORIDE 0.9 % IV SOLN
300.0000 mg | Freq: Once | INTRAVENOUS | Status: AC
  Administered 2020-05-22: 300 mg via INTRAVENOUS
  Filled 2020-05-22: qty 10

## 2020-05-22 MED ORDER — DIPHENHYDRAMINE HCL 50 MG/ML IJ SOLN
50.0000 mg | Freq: Once | INTRAMUSCULAR | Status: AC
  Administered 2020-05-22: 50 mg via INTRAVENOUS

## 2020-05-22 MED ORDER — METHYLPREDNISOLONE SODIUM SUCC 125 MG IJ SOLR
125.0000 mg | Freq: Once | INTRAMUSCULAR | Status: AC
  Administered 2020-05-22: 125 mg via INTRAVENOUS

## 2020-05-22 MED ORDER — FAMOTIDINE IN NACL 20-0.9 MG/50ML-% IV SOLN
INTRAVENOUS | Status: AC
  Filled 2020-05-22: qty 50

## 2020-05-22 MED ORDER — DIPHENHYDRAMINE HCL 50 MG/ML IJ SOLN
INTRAMUSCULAR | Status: AC
  Filled 2020-05-22: qty 1

## 2020-05-22 NOTE — Patient Instructions (Signed)

## 2020-05-28 DIAGNOSIS — Z20822 Contact with and (suspected) exposure to covid-19: Secondary | ICD-10-CM | POA: Diagnosis not present

## 2020-05-29 ENCOUNTER — Other Ambulatory Visit: Payer: Self-pay

## 2020-05-29 ENCOUNTER — Inpatient Hospital Stay: Payer: 59

## 2020-05-29 ENCOUNTER — Inpatient Hospital Stay: Payer: 59 | Admitting: Hematology

## 2020-05-29 DIAGNOSIS — D509 Iron deficiency anemia, unspecified: Secondary | ICD-10-CM | POA: Diagnosis not present

## 2020-05-29 DIAGNOSIS — Z79899 Other long term (current) drug therapy: Secondary | ICD-10-CM | POA: Diagnosis not present

## 2020-05-29 DIAGNOSIS — Z87440 Personal history of urinary (tract) infections: Secondary | ICD-10-CM | POA: Diagnosis not present

## 2020-05-29 DIAGNOSIS — R519 Headache, unspecified: Secondary | ICD-10-CM | POA: Diagnosis not present

## 2020-05-29 DIAGNOSIS — N920 Excessive and frequent menstruation with regular cycle: Secondary | ICD-10-CM | POA: Diagnosis not present

## 2020-05-29 LAB — CBC WITH DIFFERENTIAL (CANCER CENTER ONLY)
Abs Immature Granulocytes: 0.02 10*3/uL (ref 0.00–0.07)
Basophils Absolute: 0.1 10*3/uL (ref 0.0–0.1)
Basophils Relative: 1 %
Eosinophils Absolute: 0.3 10*3/uL (ref 0.0–0.5)
Eosinophils Relative: 5 %
HCT: 35.1 % — ABNORMAL LOW (ref 36.0–46.0)
Hemoglobin: 11 g/dL — ABNORMAL LOW (ref 12.0–15.0)
Immature Granulocytes: 0 %
Lymphocytes Relative: 29 %
Lymphs Abs: 1.8 10*3/uL (ref 0.7–4.0)
MCH: 25.9 pg — ABNORMAL LOW (ref 26.0–34.0)
MCHC: 31.3 g/dL (ref 30.0–36.0)
MCV: 82.8 fL (ref 80.0–100.0)
Monocytes Absolute: 0.4 10*3/uL (ref 0.1–1.0)
Monocytes Relative: 7 %
Neutro Abs: 3.6 10*3/uL (ref 1.7–7.7)
Neutrophils Relative %: 58 %
Platelet Count: 314 10*3/uL (ref 150–400)
RBC: 4.24 MIL/uL (ref 3.87–5.11)
RDW: 20.8 % — ABNORMAL HIGH (ref 11.5–15.5)
WBC Count: 6.1 10*3/uL (ref 4.0–10.5)
nRBC: 0 % (ref 0.0–0.2)

## 2020-05-29 LAB — IRON AND TIBC
Iron: 57 ug/dL (ref 41–142)
Saturation Ratios: 17 % — ABNORMAL LOW (ref 21–57)
TIBC: 333 ug/dL (ref 236–444)
UIBC: 276 ug/dL (ref 120–384)

## 2020-05-29 LAB — FERRITIN: Ferritin: 132 ng/mL (ref 11–307)

## 2020-05-31 NOTE — Progress Notes (Signed)
[place]   Telephone:(336) [phone] Fax:(336) [phone]   Clinic Follow up Note   Patient Care Team: Antony Contras, MD as PCP - General (Family Medicine) Truitt Merle, MD as Consulting Physician (Hematology)  Date of Service:  06/05/2020  CHIEF COMPLAINT: Iron deficient Anemia  CURRENT THERAPY:  IV Iron as need, if ferritin<30   INTERVAL HISTORY:  Donna Conley is here for a follow up of IDA. She presents to the clinic alone.  She received 1 dose of Feraheme in June, but had nausea and shortness of breath, and infusion was stopped..  Her IV iron was switched to Venofer, and she received 2 doses of 300 mg in the past months.  She developed mild today after infusion, similar to sinus headache, no other neurological symptoms, resolved in a few days.  She also had a mild numbness in the left arm after second dose of venofer.  She otherwise tolerated treatment well, fatigue slightly improved.  No other new complaints.  Review of system otherwise negative.   MEDICAL HISTORY:  Past Medical History:  Diagnosis Date  . Urinary tract infection     SURGICAL HISTORY: Past Surgical History:  Procedure Laterality Date  . CESAREAN SECTION    . CESAREAN SECTION  2009   General Anesthesia  . CESAREAN SECTION  09/24/2012   Procedure: CESAREAN SECTION;  Surgeon: Shelly Bombard, MD;  Location: [facility];  Service: Obstetrics;  Laterality: N/A;  . LAPAROSCOPIC TUBAL LIGATION  11/20/2012   Procedure: LAPAROSCOPIC TUBAL LIGATION;  Surgeon: Lahoma Crocker, MD;  Location: [facility];  Service: Gynecology;  Laterality: Bilateral;    I have reviewed the social history and family history with the patient and they are unchanged from previous note.  ALLERGIES:  is allergic to feraheme [ferumoxytol].  MEDICATIONS:  Current Outpatient Medications  Medication Sig Dispense Refill  . ferrous sulfate 325 (65 FE) MG tablet Take 325 mg by mouth daily with breakfast.    . Vitamin D,  Ergocalciferol, (DRISDOL) 50000 units CAPS capsule Take 1 capsule by mouth once a week.    . cephALEXin (KEFLEX) 500 MG capsule Take 1 capsule (500 mg total) by mouth 2 (two) times daily. 14 capsule 0  . phentermine (ADIPEX-P) 37.5 MG tablet Take 37.5 mg by mouth daily.  0  . valACYclovir (VALTREX) 500 MG tablet TAKE 1 TABLET BY MOUTH TWICE A DAY FOR 5 DAYS THEN TAKE 1 TABLET EVERY DAY THEREAFTER (Patient not taking: Reported on 02/27/2018) 90 tablet 2   No current facility-administered medications for this visit.    PHYSICAL EXAMINATION: ECOG PERFORMANCE STATUS: 0 - Asymptomatic  Vitals:   06/05/20 0810  BP: (!) 130/86  Pulse: 72  Resp: 18  Temp: 98.1 F (36.7 C)  SpO2: 100%   Filed Weights   06/05/20 0810  Weight: (!) 228 lb 4.8 oz (103.6 kg)    GENERAL:alert, no distress and comfortable SKIN: skin color, texture, turgor are normal, no rashes or significant lesions EYES: normal, Conjunctiva are pink and non-injected, sclera clear Musculoskeletal:no cyanosis of digits and no clubbing  NEURO: alert & oriented x 3 with fluent speech, no focal motor/sensory deficits  LABORATORY DATA:  I have reviewed the data as listed CBC Latest Ref Rng & Units 05/29/2020 12/18/2013 11/16/2012  WBC 4.0 - 10.5 K/uL 6.1 3.5(L) 5.8  Hemoglobin 12.0 - 15.0 g/dL 11.0(L) 11.1(L) 10.6(L)  Hematocrit 36 - 46 % 35.1(L) 32.5(L) 31.3(L)  Platelets 150 - 400 K/uL 314 257 260  CMP Latest Ref Rng & Units 12/18/2013 04/21/2012 07/24/2010  Glucose 70 - 99 mg/dL 99 85 94  BUN 6 - 23 mg/dL 8 7 7   Creatinine 0.50 - 1.10 mg/dL 0.80 0.66 0.77  Sodium 137 - 147 mEq/L 139 133(L) 137  Potassium 3.7 - 5.3 mEq/L 3.6(L) 3.8 3.4(L)  Chloride 96 - 112 mEq/L 104 102 105  CO2 19 - 32 mEq/L 23 23 28   Calcium 8.4 - 10.5 mg/dL 9.4 9.1 8.9  Total Protein 6.0 - 8.3 g/dL 7.8 6.6 6.7  Total Bilirubin 0.3 - 1.2 mg/dL 0.5 0.2(L) 0.5  Alkaline Phos 39 - 117 U/L 49 37(L) 37(L)  AST 0 - 37 U/L 18 16 13   ALT 0 - 35 U/L 14 20 9        RADIOGRAPHIC STUDIES: I have personally reviewed the radiological images as listed and agreed with the findings in the report. No results found.   ASSESSMENT & PLAN:  Donna Conley is a 34 y.o. female with    1.Iron Deficient Anemia, probably from menorrhagia -She has had a mildmicrocyticanemia since 2011, with hemoglobinin 9-11.5 range. She did have a normal CBC with normal MCV in 2009 -Her initial outside labs from her PCPwas found to have abnormal CBC fromFeb and March 2019 with Hgb of 9.2 and 9.9,low ferritin,low HCT, low MCV, low MCH, and high RDW,which is consistent with iron deficient anemia. She does not have overt GI bleedingthat would cause her to lose blood.   Her iron deficient anemia is probably from menorrhagia -She was on Oral ferrous sulfate 325mg  once daily but IDA did not improve much.  -I started her on IV iron, first dose Feraheme, subsequently changed to Venofer due to possible allergy reactions (nausea and SOB).  She tolerated better, did have some headache today after infusion. -I will change her benefit to 200 mg weekly if needed, will proceed one dose today with premeds  -Renal labs reviewed, iron deficiency resolved, her hemoglobin improved to 11, still mildly anemic.  She may has component of anemia of chronic disease. -Follow-up CBC every 3 months, will consider IV benefit if ferritin less than 30   PLAN: -We will change IV Venofer to 200 mg with premedication loratadine and Tylenol, due to her previous headache after infusion -Lab every 3 months, IV Venofer if ferritin less than 30 -f/u in 6 months  -Patient wishes to get COVID vaccine in our clinic, I will schedule for her   No problem-specific Assessment & Plan notes found for this encounter.   No orders of the defined types were placed in this encounter.  All questions were answered. The patient knows to call the clinic with any problems, questions or concerns. No barriers to  learning was detected. The total time spent in the appointment was 25 minutes.     Truitt Merle, MD 06/05/2020   I, Joslyn Devon, am acting as scribe for Truitt Merle, MD.   I have reviewed the above documentation for accuracy and completeness, and I agree with the above.

## 2020-06-05 ENCOUNTER — Encounter: Payer: Self-pay | Admitting: Hematology

## 2020-06-05 ENCOUNTER — Inpatient Hospital Stay: Payer: 59

## 2020-06-05 ENCOUNTER — Other Ambulatory Visit: Payer: Self-pay

## 2020-06-05 ENCOUNTER — Inpatient Hospital Stay (HOSPITAL_BASED_OUTPATIENT_CLINIC_OR_DEPARTMENT_OTHER): Payer: 59 | Admitting: Hematology

## 2020-06-05 VITALS — BP 130/86 | HR 72 | Temp 98.1°F | Resp 18 | Ht 64.0 in | Wt 228.3 lb

## 2020-06-05 VITALS — BP 129/92 | HR 69 | Temp 98.6°F | Resp 18

## 2020-06-05 DIAGNOSIS — D500 Iron deficiency anemia secondary to blood loss (chronic): Secondary | ICD-10-CM | POA: Diagnosis not present

## 2020-06-05 DIAGNOSIS — Z79899 Other long term (current) drug therapy: Secondary | ICD-10-CM | POA: Diagnosis not present

## 2020-06-05 DIAGNOSIS — D509 Iron deficiency anemia, unspecified: Secondary | ICD-10-CM

## 2020-06-05 DIAGNOSIS — N920 Excessive and frequent menstruation with regular cycle: Secondary | ICD-10-CM | POA: Diagnosis not present

## 2020-06-05 DIAGNOSIS — Z87440 Personal history of urinary (tract) infections: Secondary | ICD-10-CM | POA: Diagnosis not present

## 2020-06-05 DIAGNOSIS — R519 Headache, unspecified: Secondary | ICD-10-CM | POA: Diagnosis not present

## 2020-06-05 MED ORDER — SODIUM CHLORIDE 0.9 % IV SOLN
200.0000 mg | Freq: Once | INTRAVENOUS | Status: AC
  Administered 2020-06-05: 200 mg via INTRAVENOUS
  Filled 2020-06-05: qty 200

## 2020-06-05 MED ORDER — LORATADINE 10 MG PO TABS
ORAL_TABLET | ORAL | Status: AC
  Filled 2020-06-05: qty 1

## 2020-06-05 MED ORDER — ACETAMINOPHEN 325 MG PO TABS
ORAL_TABLET | ORAL | Status: AC
  Filled 2020-06-05: qty 2

## 2020-06-05 MED ORDER — LORATADINE 10 MG PO TABS
10.0000 mg | ORAL_TABLET | Freq: Once | ORAL | Status: AC
  Administered 2020-06-05: 10 mg via ORAL

## 2020-06-05 MED ORDER — ACETAMINOPHEN 325 MG PO TABS
650.0000 mg | ORAL_TABLET | Freq: Once | ORAL | Status: AC
  Administered 2020-06-05: 650 mg via ORAL

## 2020-06-05 NOTE — Patient Instructions (Signed)

## 2020-06-06 ENCOUNTER — Telehealth: Payer: Self-pay | Admitting: Hematology

## 2020-06-06 NOTE — Telephone Encounter (Signed)
Scheduled per 7/26 los. Pt is aware of appt times and dates.

## 2020-06-27 ENCOUNTER — Telehealth: Payer: Self-pay | Admitting: Nurse Practitioner

## 2020-06-27 DIAGNOSIS — R110 Nausea: Secondary | ICD-10-CM

## 2020-06-27 MED ORDER — PROMETHAZINE HCL 25 MG PO TABS: 25.0000 mg | ORAL_TABLET | Freq: Three times a day (TID) | ORAL | 0 refills | Status: DC | PRN

## 2020-06-27 NOTE — Telephone Encounter (Signed)
New onset of nausea, lightheadedness and headache today. Worse after eating a salad from [employer]. No diarrhea S/p tubal ligation.

## 2020-07-09 ENCOUNTER — Telehealth: Payer: 59 | Admitting: Physician Assistant

## 2020-07-09 DIAGNOSIS — J019 Acute sinusitis, unspecified: Secondary | ICD-10-CM | POA: Diagnosis not present

## 2020-07-09 DIAGNOSIS — B9789 Other viral agents as the cause of diseases classified elsewhere: Secondary | ICD-10-CM

## 2020-07-09 MED ORDER — FLUTICASONE PROPIONATE 50 MCG/ACT NA SUSP
2.0000 | Freq: Every day | NASAL | 6 refills | Status: DC
Start: 2020-07-09 — End: 2023-05-19

## 2020-07-09 NOTE — Progress Notes (Signed)

## 2020-07-19 ENCOUNTER — Telehealth: Payer: Self-pay | Admitting: Hematology

## 2020-07-19 NOTE — Telephone Encounter (Signed)
Rescheduled appts on 10/26 to 10/25 per pt request. Pt is aware of appt time and date

## 2020-09-04 ENCOUNTER — Other Ambulatory Visit: Payer: 59

## 2020-09-05 ENCOUNTER — Other Ambulatory Visit: Payer: 59

## 2020-09-18 ENCOUNTER — Inpatient Hospital Stay: Payer: 59 | Attending: Hematology

## 2020-09-18 ENCOUNTER — Other Ambulatory Visit: Payer: Self-pay

## 2020-09-18 DIAGNOSIS — D509 Iron deficiency anemia, unspecified: Secondary | ICD-10-CM | POA: Insufficient documentation

## 2020-09-18 DIAGNOSIS — Z87440 Personal history of urinary (tract) infections: Secondary | ICD-10-CM | POA: Insufficient documentation

## 2020-09-18 DIAGNOSIS — Z79899 Other long term (current) drug therapy: Secondary | ICD-10-CM | POA: Insufficient documentation

## 2020-09-18 DIAGNOSIS — Z7689 Persons encountering health services in other specified circumstances: Secondary | ICD-10-CM | POA: Diagnosis not present

## 2020-09-18 LAB — IRON AND TIBC
Iron: 41 ug/dL (ref 41–142)
Saturation Ratios: 12 % — ABNORMAL LOW (ref 21–57)
TIBC: 349 ug/dL (ref 236–444)
UIBC: 308 ug/dL (ref 120–384)

## 2020-09-18 LAB — CBC WITH DIFFERENTIAL (CANCER CENTER ONLY)
Abs Immature Granulocytes: 0.01 10*3/uL (ref 0.00–0.07)
Basophils Absolute: 0.1 10*3/uL (ref 0.0–0.1)
Basophils Relative: 1 %
Eosinophils Absolute: 0.5 10*3/uL (ref 0.0–0.5)
Eosinophils Relative: 8 %
HCT: 32.6 % — ABNORMAL LOW (ref 36.0–46.0)
Hemoglobin: 10.9 g/dL — ABNORMAL LOW (ref 12.0–15.0)
Immature Granulocytes: 0 %
Lymphocytes Relative: 31 %
Lymphs Abs: 1.7 10*3/uL (ref 0.7–4.0)
MCH: 29.1 pg (ref 26.0–34.0)
MCHC: 33.4 g/dL (ref 30.0–36.0)
MCV: 86.9 fL (ref 80.0–100.0)
Monocytes Absolute: 0.3 10*3/uL (ref 0.1–1.0)
Monocytes Relative: 6 %
Neutro Abs: 3.1 10*3/uL (ref 1.7–7.7)
Neutrophils Relative %: 54 %
Platelet Count: 294 10*3/uL (ref 150–400)
RBC: 3.75 MIL/uL — ABNORMAL LOW (ref 3.87–5.11)
RDW: 13.8 % (ref 11.5–15.5)
WBC Count: 5.7 10*3/uL (ref 4.0–10.5)
nRBC: 0 % (ref 0.0–0.2)

## 2020-09-18 LAB — FERRITIN: Ferritin: 21 ng/mL (ref 11–307)

## 2020-10-20 DIAGNOSIS — M25531 Pain in right wrist: Secondary | ICD-10-CM | POA: Diagnosis not present

## 2020-10-23 ENCOUNTER — Other Ambulatory Visit (HOSPITAL_COMMUNITY): Payer: Self-pay | Admitting: Sports Medicine

## 2020-10-23 DIAGNOSIS — M25531 Pain in right wrist: Secondary | ICD-10-CM

## 2020-10-28 ENCOUNTER — Other Ambulatory Visit: Payer: Self-pay

## 2020-10-28 ENCOUNTER — Ambulatory Visit (HOSPITAL_COMMUNITY)
Admission: RE | Admit: 2020-10-28 | Discharge: 2020-10-28 | Disposition: A | Discharge status: Home / Self Care | Payer: 59 | Source: Ambulatory Visit | Attending: Sports Medicine | Admitting: Sports Medicine

## 2020-10-28 DIAGNOSIS — M25531 Pain in right wrist: Secondary | ICD-10-CM | POA: Insufficient documentation

## 2020-10-28 DIAGNOSIS — M7989 Other specified soft tissue disorders: Secondary | ICD-10-CM | POA: Diagnosis not present

## 2020-11-04 ENCOUNTER — Telehealth: Payer: 59 | Admitting: Nurse Practitioner

## 2020-11-04 DIAGNOSIS — B370 Candidal stomatitis: Secondary | ICD-10-CM

## 2020-11-04 MED ORDER — CLOTRIMAZOLE 10 MG MT TROC: 10.0000 mg | Freq: Every day | OROMUCOSAL | 0 refills | Status: AC

## 2020-11-04 NOTE — Progress Notes (Signed)
E-Visit for Donna Conley  We are sorry that you are not feeling well.  Here is how we plan to help! Please be advised that an accurate diagnosis is difficult to make without media to review your symptoms. If your symptoms do not improve with the treatment provided, please follow-up with your primary care physician for a face-to-face visit.  Based on what you have shared with me, it appears that you may have thrush.     The following medications should decrease your symptoms and help with healing. Clotrimazole oropharyngeal, take 10mg  orally, dissolve in the mouth 5 times daily for 14 days.  Donna Conley is an oral infection resulting from yeast called Candida. It is a superficial local mucosal (or infecting the mucosa) infection. It is seen more frequently with local and systemic immunological suppression. Donna Conley is  harmless, but you can experience mouth discomfort and may make it difficult to eat, drink, and brush your teeth.     There are several types of thrush that can develop. Some common causes and factors that may aggravate thrush include: . An associated esophageal infection . A suppressed immune system that may be caused by infections such as HIV . Use of dentures . Malnutrition . Cancer chemotherapy or radiotherapy . Pregnancy . Recent immunosuppressive therapy or antibiotic therapy  Prevention: . Talk to your doctor if you are continuing to experience symptoms of thrush as this may need further evaluation  . Ensure you are receiving adequate vitamin intake to support a healthy immune system. Marland Kitchen Keep your mouth clean with daily brushing and flossing  Home Care: . The goal with treatment is to ease the symptoms.  . Eat soft foods and avoid rough, crunchy foods . Use a very soft toothbrush . If you have dentures or dental hardware that you feel is not fitting well or contributing to his, please see your dentist. . Use saltwater mouthwash which helps healing. Dissolve a  teaspoon of salt  in a glass of warm water. Swish around your mouth and spit it out. This can be used as needed if it is soothing.   GET HELP RIGHT AWAY IF: . Persistent thrush require checking IN PERSON (face to face).  . You will need follow up If you have a non-painful ulcer in 1 or more areas of your mouth . Thrush that does not improve with treatment or continues to persistIf you develop a fever, swollen glands and begin to feel unwell . Ulcers that developed after starting a new medication  MAKE SURE YOU:  Understand these instructions.  Will watch your condition.  Will get help right away if you are not doing well or get worse.  Your e-visit answers were reviewed by a board certified advanced clinical practitioner to complete your personal care plan.  Depending upon the condition, your plan could have included both over the counter or prescription medications.    Please review your pharmacy choice.  Be sure that the pharmacy you have chosen is open so that you can pick up your prescription now.  If there is a problem, you can message your provider in [place] to have the prescription routed to another pharmacy.    Your safety is important to [place].  If you have drug allergies check our prescription carefully.  For the next 24 hours you can use MyChart to ask questions about today's visit, request a non-urgent call back, or ask for a work or school excuse from your e-visit provider.  You will get an email  with a survey asking about your experience and to give [place] any feedback.  I hope that your e-visit has been valuable and will speed your recovery.  I have spent at least 5 minutes reviewing and documenting in the patient's chart.

## 2020-11-14 DIAGNOSIS — M654 Radial styloid tenosynovitis [de Quervain]: Secondary | ICD-10-CM | POA: Diagnosis not present

## 2020-12-04 ENCOUNTER — Other Ambulatory Visit: Payer: Self-pay

## 2020-12-04 DIAGNOSIS — M654 Radial styloid tenosynovitis [de Quervain]: Secondary | ICD-10-CM | POA: Diagnosis not present

## 2020-12-04 DIAGNOSIS — M65831 Other synovitis and tenosynovitis, right forearm: Secondary | ICD-10-CM | POA: Diagnosis not present

## 2020-12-04 DIAGNOSIS — M67431 Ganglion, right wrist: Secondary | ICD-10-CM | POA: Diagnosis not present

## 2020-12-04 NOTE — Progress Notes (Signed)
[place]   Telephone:(336) [phone] Fax:(336) [phone]   Clinic Follow up Note   Patient Care Team: Antony Contras, MD as PCP - General (Family Medicine) Truitt Merle, MD as Consulting Physician (Hematology)  Date of Service:  12/06/2020  CHIEF COMPLAINT: Iron deficient Anemia   CURRENT THERAPY:  IV Iron as need, if ferritin<30   INTERVAL HISTORY:  Donna Conley is here for a follow up of anemia. She was last seen by me 6 months ago. She presents to the clinic alone.  She is clinically doing very well, with mild and stable fatigue, no chest pain or dyspnea.  She underwent right wrist cyst removal last week, still in a sling, pain is tolerable.     All other systems were reviewed with the patient and are negative.  MEDICAL HISTORY:  Past Medical History:  Diagnosis Date  . Urinary tract infection     SURGICAL HISTORY: Past Surgical History:  Procedure Laterality Date  . CESAREAN SECTION    . CESAREAN SECTION  2009   General Anesthesia  . CESAREAN SECTION  09/24/2012   Procedure: CESAREAN SECTION;  Surgeon: Shelly Bombard, MD;  Location: [facility];  Service: Obstetrics;  Laterality: N/A;  . LAPAROSCOPIC TUBAL LIGATION  11/20/2012   Procedure: LAPAROSCOPIC TUBAL LIGATION;  Surgeon: Lahoma Crocker, MD;  Location: [facility];  Service: Gynecology;  Laterality: Bilateral;    I have reviewed the social history and family history with the patient and they are unchanged from previous note.  ALLERGIES:  is allergic to feraheme [ferumoxytol].  MEDICATIONS:  Current Outpatient Medications  Medication Sig Dispense Refill  . cephALEXin (KEFLEX) 500 MG capsule Take 1 capsule (500 mg total) by mouth 2 (two) times daily. 14 capsule 0  . ferrous sulfate 325 (65 FE) MG tablet Take 325 mg by mouth daily with breakfast.    . fluticasone (FLONASE) 50 MCG/ACT nasal spray Place 2 sprays into both nostrils daily. 16 g 6  . phentermine (ADIPEX-P) 37.5 MG tablet Take 37.5 mg by  mouth daily.  0  . promethazine (PHENERGAN) 25 MG tablet Take 1 tablet (25 mg total) by mouth every 8 (eight) hours as needed for nausea or vomiting. 20 tablet 0  . Vitamin D, Ergocalciferol, (DRISDOL) 50000 units CAPS capsule Take 1 capsule by mouth once a week.     No current facility-administered medications for this visit.    PHYSICAL EXAMINATION: ECOG PERFORMANCE STATUS: 1 - Symptomatic but completely ambulatory  Vitals:   12/06/20 0805  BP: 130/82  Pulse: 72  Resp: 18  Temp: 98.7 F (37.1 C)  SpO2: 99%   Filed Weights   12/06/20 0805  Weight: 232 lb (105.2 kg)    GENERAL:alert, no distress and comfortable SKIN: skin color, texture, turgor are normal, no rashes or significant lesions EYES: normal, Conjunctiva are pink and non-injected, sclera clear Musculoskeletal:no cyanosis of digits and no clubbing, right hand and wrist in a cast and sling   NEURO: alert & oriented x 3 with fluent speech, no focal motor/sensory deficits  LABORATORY DATA:  I have reviewed the data as listed CBC Latest Ref Rng & Units 12/06/2020 09/18/2020 05/29/2020  WBC 4.0 - 10.5 K/uL 4.8 5.7 6.1  Hemoglobin 12.0 - 15.0 g/dL 10.8(L) 10.9(L) 11.0(L)  Hematocrit 36.0 - 46.0 % 32.3(L) 32.6(L) 35.1(L)  Platelets 150 - 400 K/uL 320 294 314     CMP Latest Ref Rng & Units 12/18/2013 04/21/2012 07/24/2010  Glucose 70 - 99 mg/dL 99 85 94  BUN 6 - 23 mg/dL 8 7 7   Creatinine 0.50 - 1.10 mg/dL 0.80 0.66 0.77  Sodium 137 - 147 mEq/L 139 133(L) 137  Potassium 3.7 - 5.3 mEq/L 3.6(L) 3.8 3.4(L)  Chloride 96 - 112 mEq/L 104 102 105  CO2 19 - 32 mEq/L 23 23 28   Calcium 8.4 - 10.5 mg/dL 9.4 9.1 8.9  Total Protein 6.0 - 8.3 g/dL 7.8 6.6 6.7  Total Bilirubin 0.3 - 1.2 mg/dL 0.5 0.2(L) 0.5  Alkaline Phos 39 - 117 U/L 49 37(L) 37(L)  AST 0 - 37 U/L 18 16 13   ALT 0 - 35 U/L 14 20 9       RADIOGRAPHIC STUDIES: I have personally reviewed the radiological images as listed and agreed with the findings in the  report. No results found.   ASSESSMENT & PLAN:  Donna Conley is a 35 y.o. 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 She did have a normal CBC with normal MCV in 2009 -Herinitialoutside labs from her [place] found to have abnormal CBC fromFeb and March 2019 with Hgb of 9.2 and 9.9,low ferritin,low HCT, low MCV, low MCH, and high RDW,which is consistent with iron deficient anemia. She does not have overt GI bleedingthat would cause her to lose blood.  Her iron deficient anemia is probably from menorrhagia -She was on Oral ferrous sulfate 325mg  once daily but IDA did not improve much.  -I started her on IV iron, first dose Feraheme, subsequently changed to Venofer due to possible allergy reactions (nausea and SOB).  She tolerated better -Lab reviewed, hemoglobin 9.8, normal MCV, iron study results are still pending today.  Her ferritin was 21 2 months ago, slightly lower today, I will set up Venofer 400 mg twice -Continue lab monitoring every 3 months -Follow-up in 9 months  PLAN: -We schedule Venofer 400 mg twice with premedication loratadine and Tylenol, due to her previous headache after infusion -lab every 3 months abd f/u in 9 months    No problem-specific Assessment & Plan notes found for this encounter.   No orders of the defined types were placed in this encounter.  All questions were answered. The patient knows to call the clinic with any problems, questions or concerns. No barriers to learning was detected. The total time spent in the appointment was 20 minutes.     Truitt Merle, MD 12/06/2020   I, Joslyn Devon, am acting as scribe for Truitt Merle, MD.   I have reviewed the above documentation for accuracy and completeness, and I agree with the above.

## 2020-12-06 ENCOUNTER — Inpatient Hospital Stay: Payer: 59

## 2020-12-06 ENCOUNTER — Inpatient Hospital Stay: Payer: 59 | Attending: Hematology | Admitting: Hematology

## 2020-12-06 ENCOUNTER — Other Ambulatory Visit: Payer: Self-pay

## 2020-12-06 ENCOUNTER — Telehealth: Payer: Self-pay | Admitting: Hematology

## 2020-12-06 ENCOUNTER — Encounter: Payer: Self-pay | Admitting: Hematology

## 2020-12-06 VITALS — BP 130/82 | HR 72 | Temp 98.7°F | Resp 18 | Ht 64.0 in | Wt 232.0 lb

## 2020-12-06 DIAGNOSIS — D500 Iron deficiency anemia secondary to blood loss (chronic): Secondary | ICD-10-CM | POA: Diagnosis not present

## 2020-12-06 DIAGNOSIS — Z79899 Other long term (current) drug therapy: Secondary | ICD-10-CM | POA: Insufficient documentation

## 2020-12-06 DIAGNOSIS — Z87440 Personal history of urinary (tract) infections: Secondary | ICD-10-CM | POA: Insufficient documentation

## 2020-12-06 DIAGNOSIS — D509 Iron deficiency anemia, unspecified: Secondary | ICD-10-CM

## 2020-12-06 LAB — IRON AND TIBC
Iron: 36 ug/dL — ABNORMAL LOW (ref 41–142)
Saturation Ratios: 9 % — ABNORMAL LOW (ref 21–57)
TIBC: 385 ug/dL (ref 236–444)
UIBC: 349 ug/dL (ref 120–384)

## 2020-12-06 LAB — CBC WITH DIFFERENTIAL (CANCER CENTER ONLY)
Abs Immature Granulocytes: 0 K/uL (ref 0.00–0.07)
Basophils Absolute: 0.1 K/uL (ref 0.0–0.1)
Basophils Relative: 1 %
Eosinophils Absolute: 0.7 K/uL — ABNORMAL HIGH (ref 0.0–0.5)
Eosinophils Relative: 14 %
HCT: 32.3 % — ABNORMAL LOW (ref 36.0–46.0)
Hemoglobin: 10.8 g/dL — ABNORMAL LOW (ref 12.0–15.0)
Immature Granulocytes: 0 %
Lymphocytes Relative: 32 %
Lymphs Abs: 1.5 K/uL (ref 0.7–4.0)
MCH: 28.4 pg (ref 26.0–34.0)
MCHC: 33.4 g/dL (ref 30.0–36.0)
MCV: 85 fL (ref 80.0–100.0)
Monocytes Absolute: 0.3 K/uL (ref 0.1–1.0)
Monocytes Relative: 5 %
Neutro Abs: 2.2 K/uL (ref 1.7–7.7)
Neutrophils Relative %: 48 %
Platelet Count: 320 K/uL (ref 150–400)
RBC: 3.8 MIL/uL — ABNORMAL LOW (ref 3.87–5.11)
RDW: 13.9 % (ref 11.5–15.5)
WBC Count: 4.8 K/uL (ref 4.0–10.5)
nRBC: 0 % (ref 0.0–0.2)

## 2020-12-06 LAB — FERRITIN: Ferritin: 13 ng/mL (ref 11–307)

## 2020-12-06 NOTE — Telephone Encounter (Signed)
Scheduled appointments per 12/6 los. Spoke to patient who is aware of appointments dates and times.  

## 2020-12-12 HISTORY — PX: WRIST SURGERY: SHX841

## 2020-12-15 ENCOUNTER — Ambulatory Visit (HOSPITAL_BASED_OUTPATIENT_CLINIC_OR_DEPARTMENT_OTHER): Payer: 59 | Admitting: Medical

## 2020-12-15 ENCOUNTER — Other Ambulatory Visit: Payer: Self-pay

## 2020-12-15 ENCOUNTER — Inpatient Hospital Stay: Payer: 59 | Attending: Hematology

## 2020-12-15 VITALS — BP 117/75 | HR 70 | Temp 98.6°F | Resp 17 | Wt 228.8 lb

## 2020-12-15 DIAGNOSIS — D508 Other iron deficiency anemias: Secondary | ICD-10-CM

## 2020-12-15 DIAGNOSIS — D509 Iron deficiency anemia, unspecified: Secondary | ICD-10-CM | POA: Insufficient documentation

## 2020-12-15 DIAGNOSIS — M67431 Ganglion, right wrist: Secondary | ICD-10-CM | POA: Diagnosis not present

## 2020-12-15 DIAGNOSIS — M654 Radial styloid tenosynovitis [de Quervain]: Secondary | ICD-10-CM | POA: Diagnosis not present

## 2020-12-15 DIAGNOSIS — R079 Chest pain, unspecified: Secondary | ICD-10-CM | POA: Diagnosis not present

## 2020-12-15 MED ORDER — ACETAMINOPHEN 325 MG PO TABS
650.0000 mg | ORAL_TABLET | Freq: Once | ORAL | Status: AC
  Administered 2020-12-15: 650 mg via ORAL

## 2020-12-15 MED ORDER — SODIUM CHLORIDE 0.9 % IV SOLN
400.0000 mg | Freq: Once | INTRAVENOUS | Status: AC
  Administered 2020-12-15: 400 mg via INTRAVENOUS
  Filled 2020-12-15: qty 20

## 2020-12-15 MED ORDER — LORATADINE 10 MG PO TABS
10.0000 mg | ORAL_TABLET | Freq: Once | ORAL | Status: AC
  Administered 2020-12-15: 10 mg via ORAL

## 2020-12-15 MED ORDER — LORATADINE 10 MG PO TABS
ORAL_TABLET | ORAL | Status: AC
  Filled 2020-12-15: qty 1

## 2020-12-15 MED ORDER — ACETAMINOPHEN 325 MG PO TABS
ORAL_TABLET | ORAL | Status: AC
  Filled 2020-12-15: qty 2

## 2020-12-15 MED ORDER — SODIUM CHLORIDE 0.9 % IV SOLN
INTRAVENOUS | Status: DC
  Filled 2020-12-15 (×2): qty 250

## 2020-12-15 NOTE — Patient Instructions (Signed)

## 2020-12-18 NOTE — Progress Notes (Signed)
Donna Conley has a history of iron deficiency anemia and was receiving IV iron today.  She reported having pain in her left chest deep to her left breast.  Her vital signs returned showing a blood pressure 136/86, oxygen of 98% on room air, temperature of 98.6, and a pulse of 87.  An EKG returned showing a normal sinus rhythm at 82 bpm with a possible right ventricular hypertrophy.  Prior EKGs dating to September 11, 2011 were reviewed.  These showed a normal sinus rhythm at 91 bpm.  The patient knows to follow-up with our office or with her primary care provider should she continue to have left chest pain.  Her EKG did not show any ischemic changes.  Sandi Mealy, MHS, PA-C Physician Assistant

## 2020-12-22 ENCOUNTER — Other Ambulatory Visit: Payer: Self-pay

## 2020-12-22 ENCOUNTER — Other Ambulatory Visit: Payer: Self-pay | Admitting: Hematology

## 2020-12-22 ENCOUNTER — Inpatient Hospital Stay: Payer: 59

## 2020-12-22 VITALS — BP 126/85 | HR 88 | Temp 98.6°F | Resp 18 | Wt 230.5 lb

## 2020-12-22 DIAGNOSIS — D509 Iron deficiency anemia, unspecified: Secondary | ICD-10-CM | POA: Diagnosis not present

## 2020-12-22 DIAGNOSIS — R079 Chest pain, unspecified: Secondary | ICD-10-CM | POA: Diagnosis not present

## 2020-12-22 MED ORDER — LORATADINE 10 MG PO TABS
ORAL_TABLET | ORAL | Status: AC
  Filled 2020-12-22: qty 1

## 2020-12-22 MED ORDER — SODIUM CHLORIDE 0.9 % IV SOLN
Freq: Once | INTRAVENOUS | Status: AC
  Filled 2020-12-22: qty 250

## 2020-12-22 MED ORDER — SODIUM CHLORIDE 0.9 % IV SOLN
400.0000 mg | Freq: Once | INTRAVENOUS | Status: AC
  Administered 2020-12-22: 400 mg via INTRAVENOUS
  Filled 2020-12-22: qty 20

## 2020-12-22 MED ORDER — ACETAMINOPHEN 325 MG PO TABS
650.0000 mg | ORAL_TABLET | Freq: Once | ORAL | Status: AC
  Administered 2020-12-22: 650 mg via ORAL

## 2020-12-22 MED ORDER — ACETAMINOPHEN 325 MG PO TABS
ORAL_TABLET | ORAL | Status: AC
  Filled 2020-12-22: qty 2

## 2020-12-22 MED ORDER — LORATADINE 10 MG PO TABS
10.0000 mg | ORAL_TABLET | Freq: Once | ORAL | Status: AC
Start: 2020-12-22 — End: 2020-12-22
  Administered 2020-12-22: 10 mg via ORAL

## 2020-12-22 NOTE — Patient Instructions (Signed)

## 2020-12-22 NOTE — Progress Notes (Signed)
Pt declined to stay 30 min post observation. VSS and pt left in stable condition, ambulatory to lobby.

## 2020-12-28 ENCOUNTER — Encounter: Payer: Self-pay | Admitting: Hematology

## 2020-12-29 DIAGNOSIS — D509 Iron deficiency anemia, unspecified: Secondary | ICD-10-CM | POA: Diagnosis not present

## 2020-12-29 DIAGNOSIS — Z6839 Body mass index (BMI) 39.0-39.9, adult: Secondary | ICD-10-CM | POA: Diagnosis not present

## 2020-12-29 DIAGNOSIS — T8089XA Other complications following infusion, transfusion and therapeutic injection, initial encounter: Secondary | ICD-10-CM | POA: Diagnosis not present

## 2020-12-29 DIAGNOSIS — E559 Vitamin D deficiency, unspecified: Secondary | ICD-10-CM | POA: Diagnosis not present

## 2021-01-05 ENCOUNTER — Telehealth: Payer: 59 | Admitting: Physician Assistant

## 2021-01-05 DIAGNOSIS — B370 Candidal stomatitis: Secondary | ICD-10-CM | POA: Diagnosis not present

## 2021-01-05 MED ORDER — CLOTRIMAZOLE 10 MG MT TROC: 10.0000 mg | Freq: Every day | OROMUCOSAL | 0 refills | Status: AC

## 2021-01-05 NOTE — Progress Notes (Signed)
E-Visit for Donna Conley  We are sorry that you are not feeling well.  Here is how we plan to help!   Based on what you have shared with me, it appears that you may have thrush.   If your symptoms do not improve with the following treatment plan, please follow-up with your primary care physician for a face-to-face visit.     The following medications should decrease your symptoms and help with healing. Clotrimazole oropharyngeal, take 10mg  orally, dissolve in the mouth 5 times daily for 14 days.  Donna Conley is an oral infection resulting from yeast called Candida. It is a superficial local mucosal (or infecting the mucosa) infection. It is seen more frequently with local and systemic immunological suppression. Donna Conley is  harmless, but you can experience mouth discomfort and may make it difficult to eat, drink, and brush your teeth.     There are several types of thrush that can develop. Some common causes and factors that may aggravate thrush include:  An associated esophageal infection  A suppressed immune system that may be caused by infections such as HIV  Use of dentures  Malnutrition  Cancer chemotherapy or radiotherapy  Pregnancy  Recent immunosuppressive therapy or antibiotic therapy  Prevention:  Talk to your doctor if you are continuing to experience symptoms of thrush as this may need further evaluation   Ensure you are receiving adequate vitamin intake to support a healthy immune system.  Keep your mouth clean with daily brushing and flossing  Home Care:  The goal with treatment is to ease the symptoms.   Eat soft foods and avoid rough, crunchy foods  Use a very soft toothbrush  If you have dentures or dental hardware that you feel is not fitting well or contributing to his, please see your dentist.  Use saltwater mouthwash which helps healing. Dissolve a  teaspoon of salt in a glass of warm water. Swish around your mouth and spit it out. This can be used as  needed if it is soothing.   GET HELP RIGHT AWAY IF:  Persistent thrush require checking IN PERSON (face to face).   You will need follow up If you have a non-painful ulcer in 1 or more areas of your mouth  Thrush that does not improve with treatment or continues to persistIf you develop a fever, swollen glands and begin to feel unwell  Ulcers that developed after starting a new medication  MAKE SURE YOU:  Understand these instructions.  Will watch your condition.  Will get help right away if you are not doing well or get worse.  Your e-visit answers were reviewed by a board certified advanced clinical practitioner to complete your personal care plan.  Depending upon the condition, your plan could have included both over the counter or prescription medications.    Please review your pharmacy choice.  Be sure that the pharmacy you have chosen is open so that you can pick up your prescription now.  If there is a problem, you can message your provider in [place] to have the prescription routed to another pharmacy.    Your safety is important to [place].  If you have drug allergies check our prescription carefully.  For the next 24 hours you can use MyChart to ask questions about today's visit, request a non-urgent call back, or ask for a work or school excuse from your e-visit provider.  You will get an email with a survey asking about your experience and to give [place] any feedback.  I hope that your e-visit has been valuable and will speed your recovery.  I have spent at least 5 minutes reviewing and documenting in the patient's chart.

## 2021-01-08 ENCOUNTER — Inpatient Hospital Stay: Payer: 59

## 2021-01-15 ENCOUNTER — Encounter: Payer: Self-pay | Admitting: Hematology

## 2021-01-16 ENCOUNTER — Other Ambulatory Visit: Payer: Self-pay | Admitting: Nurse Practitioner

## 2021-01-16 DIAGNOSIS — D509 Iron deficiency anemia, unspecified: Secondary | ICD-10-CM

## 2021-02-01 ENCOUNTER — Telehealth: Payer: Self-pay | Admitting: Hematology

## 2021-02-01 DIAGNOSIS — J0110 Acute frontal sinusitis, unspecified: Secondary | ICD-10-CM | POA: Diagnosis not present

## 2021-02-01 DIAGNOSIS — B370 Candidal stomatitis: Secondary | ICD-10-CM | POA: Diagnosis not present

## 2021-02-01 NOTE — Telephone Encounter (Signed)
Cancelled 3/28 appt per patient request from sch msg . Called and left msg for patient to call back if she needs to reschedule

## 2021-02-05 ENCOUNTER — Inpatient Hospital Stay: Payer: 59

## 2021-02-12 ENCOUNTER — Other Ambulatory Visit (HOSPITAL_COMMUNITY): Payer: Self-pay

## 2021-02-12 MED ORDER — VALACYCLOVIR HCL 500 MG PO TABS
500.0000 mg | ORAL_TABLET | Freq: Every day | ORAL | 1 refills | Status: DC
  Filled 2021-02-12: qty 10, 10d supply, fill #0

## 2021-02-15 ENCOUNTER — Other Ambulatory Visit (HOSPITAL_COMMUNITY): Payer: Self-pay

## 2021-03-05 ENCOUNTER — Inpatient Hospital Stay: Payer: 59 | Attending: Hematology

## 2021-03-05 ENCOUNTER — Other Ambulatory Visit: Payer: Self-pay

## 2021-03-05 DIAGNOSIS — Z7689 Persons encountering health services in other specified circumstances: Secondary | ICD-10-CM | POA: Diagnosis not present

## 2021-03-05 DIAGNOSIS — D509 Iron deficiency anemia, unspecified: Secondary | ICD-10-CM | POA: Diagnosis not present

## 2021-03-05 DIAGNOSIS — T8089XA Other complications following infusion, transfusion and therapeutic injection, initial encounter: Secondary | ICD-10-CM | POA: Diagnosis not present

## 2021-03-05 LAB — CBC WITH DIFFERENTIAL (CANCER CENTER ONLY)
Abs Immature Granulocytes: 0 10*3/uL (ref 0.00–0.07)
Basophils Absolute: 0.1 10*3/uL (ref 0.0–0.1)
Basophils Relative: 1 %
Eosinophils Absolute: 0.6 10*3/uL — ABNORMAL HIGH (ref 0.0–0.5)
Eosinophils Relative: 13 %
HCT: 34.5 % — ABNORMAL LOW (ref 36.0–46.0)
Hemoglobin: 11.6 g/dL — ABNORMAL LOW (ref 12.0–15.0)
Immature Granulocytes: 0 %
Lymphocytes Relative: 31 %
Lymphs Abs: 1.4 10*3/uL (ref 0.7–4.0)
MCH: 29.1 pg (ref 26.0–34.0)
MCHC: 33.6 g/dL (ref 30.0–36.0)
MCV: 86.7 fL (ref 80.0–100.0)
Monocytes Absolute: 0.3 10*3/uL (ref 0.1–1.0)
Monocytes Relative: 6 %
Neutro Abs: 2.3 10*3/uL (ref 1.7–7.7)
Neutrophils Relative %: 49 %
Platelet Count: 289 10*3/uL (ref 150–400)
RBC: 3.98 MIL/uL (ref 3.87–5.11)
RDW: 13.8 % (ref 11.5–15.5)
WBC Count: 4.6 10*3/uL (ref 4.0–10.5)
nRBC: 0 % (ref 0.0–0.2)

## 2021-03-05 LAB — IRON AND TIBC
Iron: 43 ug/dL (ref 41–142)
Saturation Ratios: 13 % — ABNORMAL LOW (ref 21–57)
TIBC: 331 ug/dL (ref 236–444)
UIBC: 288 ug/dL (ref 120–384)

## 2021-03-05 LAB — FERRITIN: Ferritin: 67 ng/mL (ref 11–307)

## 2021-03-07 LAB — CELIAC PANEL 10
Antigliadin Abs, IgA: 7 units (ref 0–19)
Endomysial Ab, IgA: NEGATIVE
Gliadin IgG: 3 units (ref 0–19)
IgA: 305 mg/dL (ref 87–352)
Tissue Transglut Ab: 4 U/mL (ref 0–5)
Tissue Transglutaminase Ab, IgA: <2 U/mL (ref 0–3)

## 2021-03-09 ENCOUNTER — Encounter: Payer: Self-pay | Admitting: Hematology

## 2021-03-11 ENCOUNTER — Encounter: Payer: Self-pay | Admitting: Hematology

## 2021-04-03 DIAGNOSIS — Z118 Encounter for screening for other infectious and parasitic diseases: Secondary | ICD-10-CM | POA: Diagnosis not present

## 2021-04-03 DIAGNOSIS — Z131 Encounter for screening for diabetes mellitus: Secondary | ICD-10-CM | POA: Diagnosis not present

## 2021-04-03 DIAGNOSIS — Z1329 Encounter for screening for other suspected endocrine disorder: Secondary | ICD-10-CM | POA: Diagnosis not present

## 2021-04-03 DIAGNOSIS — Z6839 Body mass index (BMI) 39.0-39.9, adult: Secondary | ICD-10-CM | POA: Diagnosis not present

## 2021-04-03 DIAGNOSIS — Z124 Encounter for screening for malignant neoplasm of cervix: Secondary | ICD-10-CM | POA: Diagnosis not present

## 2021-04-03 DIAGNOSIS — Z01419 Encounter for gynecological examination (general) (routine) without abnormal findings: Secondary | ICD-10-CM | POA: Diagnosis not present

## 2021-04-03 DIAGNOSIS — Z13220 Encounter for screening for lipoid disorders: Secondary | ICD-10-CM | POA: Diagnosis not present

## 2021-04-03 DIAGNOSIS — Z0000 Encounter for general adult medical examination without abnormal findings: Secondary | ICD-10-CM | POA: Diagnosis not present

## 2021-04-03 DIAGNOSIS — Z114 Encounter for screening for human immunodeficiency virus [HIV]: Secondary | ICD-10-CM | POA: Diagnosis not present

## 2021-04-03 DIAGNOSIS — Z01411 Encounter for gynecological examination (general) (routine) with abnormal findings: Secondary | ICD-10-CM | POA: Diagnosis not present

## 2021-04-03 DIAGNOSIS — Z1159 Encounter for screening for other viral diseases: Secondary | ICD-10-CM | POA: Diagnosis not present

## 2021-04-03 DIAGNOSIS — Z113 Encounter for screening for infections with a predominantly sexual mode of transmission: Secondary | ICD-10-CM | POA: Diagnosis not present

## 2021-04-19 DIAGNOSIS — N920 Excessive and frequent menstruation with regular cycle: Secondary | ICD-10-CM | POA: Diagnosis not present

## 2021-04-22 ENCOUNTER — Telehealth: Payer: 59 | Admitting: Physician Assistant

## 2021-04-22 ENCOUNTER — Encounter: Payer: 59 | Admitting: Physician Assistant

## 2021-04-22 DIAGNOSIS — B360 Pityriasis versicolor: Secondary | ICD-10-CM

## 2021-04-22 MED ORDER — KETOCONAZOLE 2 % EX CREA: 1.0000 "application " | TOPICAL_CREAM | Freq: Every day | CUTANEOUS | 0 refills | Status: DC

## 2021-04-22 NOTE — Progress Notes (Signed)
E Visit for Rash  We are sorry that you are not feeling well. Here is how we plan to help!  I believe your rash appears to be, most likely, tinea versicolor.   Tinea versicolor is a common fungal infection of the skin. The fungus interferes with the normal pigmentation of the skin, resulting in small, discolored patches. These patches may be lighter or darker in color than the surrounding skin and most commonly affect the trunk and shoulders.  I will prescribe a topical cream called Ketoconazole that you can apply to affected areas twice daily. Also using the shampoo Selsum Blue as a body wash can help prevent spread as well.   HOME CARE:  Take cool showers and avoid direct sunlight. Apply cool compress or wet dressings. Take a bath in an oatmeal bath.  Sprinkle content of one Aveeno packet under running faucet with comfortably warm water.  Bathe for 15-20 minutes, 1-2 times daily.  Pat dry with a towel. Do not rub the rash. Use hydrocortisone cream. Take an antihistamine like Benadryl for widespread rashes that itch.  The adult dose of Benadryl is 25-50 mg by mouth 4 times daily. Caution:  This type of medication may cause sleepiness.  Do not drink alcohol, drive, or operate dangerous machinery while taking antihistamines.  Do not take these medications if you have prostate enlargement.  Read package instructions thoroughly on all medications that you take.  GET HELP RIGHT AWAY IF:  Symptoms don't go away after treatment. Severe itching that persists. If you rash spreads or swells. If you rash begins to smell. If it blisters and opens or develops a yellow-brown crust. You develop a fever. You have a sore throat. You become short of breath.  MAKE SURE YOU:  Understand these instructions. Will watch your condition. Will get help right away if you are not doing well or get worse.  Thank you for choosing an e-visit. Your e-visit answers were reviewed by a board certified advanced  clinical practitioner to complete your personal care plan. Depending upon the condition, your plan could have included both over the counter or prescription medications. Please review your pharmacy choice. Be sure that the pharmacy you have chosen is open so that you can pick up your prescription now.  If there is a problem you may message your provider in [place] to have the prescription routed to another pharmacy. Your safety is important to [place]. If you have drug allergies check your prescription carefully.  For the next 24 hours, you can use MyChart to ask questions about today's visit, request a non-urgent call back, or ask for a work or school excuse from your e-visit provider. You will get an email in the next two days asking about your experience. I hope that your e-visit has been valuable and will speed your recovery.  I provided 6 minutes of non face-to-face time during this encounter for chart review and documentation.

## 2021-04-22 NOTE — Progress Notes (Signed)
Provider notified patient x 3 and waited a total of 25 minutes. Patient never connected, but just submitted an evisit instead. Will mark acct erroneous

## 2021-05-06 ENCOUNTER — Telehealth: Payer: 59 | Admitting: Physician Assistant

## 2021-05-06 ENCOUNTER — Encounter: Payer: Self-pay | Admitting: Physician Assistant

## 2021-05-06 DIAGNOSIS — J029 Acute pharyngitis, unspecified: Secondary | ICD-10-CM

## 2021-05-06 DIAGNOSIS — U071 COVID-19: Secondary | ICD-10-CM

## 2021-05-06 NOTE — Progress Notes (Signed)
E-Visit for Corona Virus Screening  Your current symptoms could be consistent with the coronavirus.  Many health care providers can now test patients at their office but not all are.  [place] has multiple testing sites. For information on our [place] testing locations and hours go to [email or web address]  We are enrolling you in our [facility] for [place] . Daily you will receive a questionnaire within the [place] website. Our COVID 19 response team will be monitoring your responses daily.  Testing Information: The COVID-19 Community Testing sites are testing BY APPOINTMENT ONLY.  You can schedule online at [email or web address]  If you do not have access to a smart phone or computer you may call [phone] for an appointment.   Additional testing sites in the Community:  For [facility] Testing sites in [place]  FaceUpdate.uy  For Pop[facility] testing sites in [place]  [email or web address]  For [facility] [email or web address]  For [facility] testing in [place] and [employer] [email or web address]  For [facility] testing in [place]   [email or web address]  For  more information about community testing call [phone]   Please quarantine yourself while awaiting your test results. Please stay home for a minimum of 10 days from the first day of illness with improving symptoms and you have had 24 hours of no fever (without the use of Tylenol (Acetaminophen) Motrin (Ibuprofen) or any fever reducing medication).  Also - Do not get tested prior to returning to work because once you have had a positive test the test can stay positive for  more than a month in some cases.   You should wear a mask or cloth face covering over your nose and mouth if you must be around other people or animals, including pets (even at home). Try to stay at least 6 feet away from other people. This will protect the people around you.  Please continue good preventive care measures, including:  frequent hand-washing, avoid touching your face, cover coughs/sneezes, stay out of crowds and keep a 6 foot distance from others.  COVID-19 is a respiratory illness with symptoms that are similar to the flu. Symptoms are typically mild to moderate, but there have been cases of severe illness and death due to the virus.   The following symptoms may appear 2-14 days after exposure: Fever Cough Shortness of breath or difficulty breathing Chills Repeated shaking with chills Muscle pain Headache Sore throat New loss of taste or smell Fatigue Congestion or runny nose Nausea or vomiting Diarrhea  Go to the nearest hospital ED for assessment if fever/cough/breathlessness are severe or illness seems like a threat to life.  It is vitally important that if you feel that you have an infection such as this virus or any other virus that you stay home and away from places where you may spread it to others.  You should avoid contact with people age 33 and older.   Your sore throat is likely viral in nature due to your current illness with Covid 19. You may try OTC throat lozenges to help with sore throat and take Tyelnol as needed. If sore throat continue or worsens, have a face to face visit for further evaluation to assess any secondary bacterial throat infection.   You may also take acetaminophen (Tylenol) as needed for fever.  Reduce your risk of any infection by using the same precautions used for avoiding the common cold or flu:  Wash your hands often with soap and warm water for at least  20 seconds.  If soap and water are not readily available, use an alcohol-based  hand sanitizer with at least 60% alcohol.  If coughing or sneezing, cover your mouth and nose by coughing or sneezing into the elbow areas of your shirt or coat, into a tissue or into your sleeve (not your hands). Avoid shaking hands with others and consider head nods or verbal greetings only. Avoid touching your eyes, nose, or mouth with unwashed hands.  Avoid close contact with people who are sick. Avoid places or events with large numbers of people in one location, like concerts or sporting events. Carefully consider travel plans you have or are making. If you are planning any travel outside or inside the [place], visit the CDC's Travelers' Health webpage for the latest health notices. If you have some symptoms but not all symptoms, continue to monitor at home and seek medical attention if your symptoms worsen. If you are having a medical emergency, call 911.  HOME CARE Only take medications as instructed by your medical team. Drink plenty of fluids and get plenty of rest. A steam or ultrasonic humidifier can help if you have congestion.   GET HELP RIGHT AWAY IF YOU HAVE EMERGENCY WARNING SIGNS** FOR COVID-19. If you or someone is showing any of these signs seek emergency medical care immediately. Call 911 or proceed to your closest emergency facility if: You develop worsening high fever. Trouble breathing Bluish lips or face Persistent pain or pressure in the chest New confusion Inability to wake or stay awake You cough up blood. Your symptoms become more severe  **This list is not all possible symptoms. Contact your medical provider for any symptoms that are sever or concerning to you.  MAKE SURE YOU  Understand these instructions. Will watch your condition. Will get help right away if you are not doing well or get worse.  Your e-visit answers were reviewed by a board certified advanced clinical practitioner to complete your personal care plan.  Depending on the condition, your plan  could have included both over the counter or prescription medications.  If there is a problem please reply once you have received a response from your provider.  Your safety is important to [place].  If you have drug allergies check your prescription carefully.    You can use MyChart to ask questions about today's visit, request a non-urgent call back, or ask for a work or school excuse for 24 hours related to this e-Visit. If it has been greater than 24 hours you will need to follow up with your provider, or enter a new e-Visit to address those concerns. You will get an e-mail in the next two days asking about your experience.  I hope that your e-visit has been valuable and will speed your recovery. Thank you for using e-visits.  I spent 5-10 minutes on review and completion of this note- Lacy Duverney [facility]

## 2021-05-07 ENCOUNTER — Other Ambulatory Visit: Payer: Self-pay

## 2021-05-07 ENCOUNTER — Encounter (HOSPITAL_COMMUNITY): Payer: Self-pay | Admitting: Emergency Medicine

## 2021-05-07 ENCOUNTER — Ambulatory Visit (HOSPITAL_COMMUNITY): Payer: Self-pay

## 2021-05-07 ENCOUNTER — Ambulatory Visit (HOSPITAL_COMMUNITY)
Admission: EM | Admit: 2021-05-07 | Discharge: 2021-05-07 | Disposition: A | Discharge status: Home / Self Care | Payer: 59 | Attending: Physician Assistant | Admitting: Physician Assistant

## 2021-05-07 DIAGNOSIS — J069 Acute upper respiratory infection, unspecified: Secondary | ICD-10-CM | POA: Diagnosis not present

## 2021-05-07 DIAGNOSIS — U071 COVID-19: Secondary | ICD-10-CM | POA: Insufficient documentation

## 2021-05-07 DIAGNOSIS — Z3202 Encounter for pregnancy test, result negative: Secondary | ICD-10-CM | POA: Diagnosis not present

## 2021-05-07 DIAGNOSIS — J029 Acute pharyngitis, unspecified: Secondary | ICD-10-CM | POA: Insufficient documentation

## 2021-05-07 LAB — POCT RAPID STREP A, ED / UC: Streptococcus, Group A Screen (Direct): NEGATIVE

## 2021-05-07 LAB — POC URINE PREG, ED: Preg Test, Ur: NEGATIVE

## 2021-05-07 MED ORDER — MOLNUPIRAVIR EUA 200MG CAPSULE: 4.0000 | ORAL_CAPSULE | Freq: Two times a day (BID) | ORAL | 0 refills | Status: AC

## 2021-05-07 MED ORDER — LIDOCAINE VISCOUS HCL 2 % MT SOLN: 15.0000 mL | Freq: Three times a day (TID) | OROMUCOSAL | 0 refills | Status: DC | PRN

## 2021-05-07 NOTE — ED Provider Notes (Signed)
[employer]    CSN: [id number] Arrival date & time: 05/07/21  4259      History   Chief Complaint Chief Complaint  Patient presents with   Cough    HPI Donna Conley is a 35 y.o. female.   Patient presents today with a 3-day history of URI symptoms.  She reports cough, sore throat, rhinorrhea.  She denies any chest pain, shortness of breath, nausea, vomiting.  She took an at-home COVID test a few days ago that was positive.  She is vaccinated against flu and COVID-19.  Reports she recently went out of town prior to symptom onset but denies any specific known sick contacts.  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  She has tried several over-the-counter medications including TheraFlu and Alka-Seltzer plus.  Denies any recent antibiotic use.  She reports sore throat pain is rated 10 on a 0-10 pain scale, localized to posterior oropharynx, described as glass or sharp, no aggravating or alleviating factors identified.  She is able to swallow and drink fluids but is having difficulty with solid food as result of pain.   Past Medical History:  Diagnosis Date   Urinary tract infection     Patient Active Problem List   Diagnosis Date Noted   Iron deficiency anemia 02/27/2018   Encounter for female sterilization procedure 11/20/2012    Past Surgical History:  Procedure Laterality Date   CESAREAN SECTION     CESAREAN SECTION  11/12/2007   General Anesthesia   CESAREAN SECTION  09/24/2012   Procedure: CESAREAN SECTION;  Surgeon: Shelly Bombard, MD;  Location: [facility];  Service: Obstetrics;  Laterality: N/A;   LAPAROSCOPIC TUBAL LIGATION  11/20/2012   Procedure: LAPAROSCOPIC TUBAL LIGATION;  Surgeon: Lahoma Crocker, MD;  Location: [facility];  Service: Gynecology;  Laterality: Bilateral;   WRIST SURGERY Right    cyst    OB History     Gravida  2   Para  2   Term  1   Preterm      AB      Living  1      SAB      IAB      Ectopic       Multiple      Live Births  1            Home Medications    Prior to Admission medications   Medication Sig Start Date End Date Taking? Authorizing Provider  DM-Phenylephrine-Acetaminophen (ALKA-SELTZER PLS SINUS & COUGH PO) Take by mouth.   Yes [provider]  lidocaine (XYLOCAINE) 2 % solution Use as directed 15 mLs in the mouth or throat every 8 (eight) hours as needed for mouth pain. 05/07/21  Yes Azyriah Nevins, Derry Skill, PA-C  molnupiravir EUA 200 mg CAPS Take 4 capsules (800 mg total) by mouth 2 (two) times daily for 5 days. 05/07/21 05/12/21 Yes Cantrell Martus K, PA-C  naproxen sodium (ALEVE) 220 MG tablet Take 220 mg by mouth.   Yes [provider]  Vitamin D, Ergocalciferol, (DRISDOL) 50000 units CAPS capsule Take 1 capsule by mouth once a week.   Yes [provider]  ferrous sulfate 325 (65 FE) MG tablet Take 325 mg by mouth daily with breakfast.    [provider]  fluticasone (FLONASE) 50 MCG/ACT nasal spray Place 2 sprays into both nostrils daily. 07/09/20   McVey, Gelene Mink, PA-C  gabapentin (NEURONTIN) 300 MG capsule Take 300 mg by mouth  at bedtime. 11/29/20   [provider]  ketoconazole (NIZORAL) 2 % cream Apply 1 application topically daily. 04/22/21   Mar Daring, PA-C  phentermine (ADIPEX-P) 37.5 MG tablet Take 37.5 mg by mouth daily. 02/02/18   [provider]  promethazine (PHENERGAN) 25 MG tablet Take 1 tablet (25 mg total) by mouth every 8 (eight) hours as needed for nausea or vomiting. 06/27/20   Nche, Charlene Brooke, NP  valACYclovir (VALTREX) 500 MG tablet Take 1 tablet (500 mg total) by mouth daily. 02/12/21     mometasone (NASONEX) 50 MCG/ACT nasal spray Place 2 sprays into the nose daily. 12/13/11 01/21/12  Julianne Rice, MD    Family History Family History  Problem Relation Age of Onset   Anemia Mother    Anemia Maternal Grandmother     Social History Social History   Tobacco Use   Smoking  status: Never   Smokeless tobacco: Never  Vaping Use   Vaping Use: Never used  Substance Use Topics   Alcohol use: No   Drug use: No     Allergies   Feraheme [ferumoxytol]   Review of Systems Review of Systems  Constitutional:  Positive for activity change and fatigue. Negative for appetite change and fever.  HENT:  Positive for congestion, sore throat and voice change (hoarse). Negative for sinus pressure and sneezing.   Respiratory:  Positive for cough. Negative for shortness of breath.   Cardiovascular:  Negative for chest pain.  Gastrointestinal:  Negative for abdominal pain, diarrhea, nausea and vomiting.  Neurological:  Positive for headaches. Negative for dizziness and light-headedness.    Physical Exam Triage Vital Signs ED Triage Vitals  Enc Vitals Group     BP 05/07/21 0917 122/86     Pulse Rate 05/07/21 0917 91     Resp 05/07/21 0917 (!) 22     Temp 05/07/21 0917 98.8 F (37.1 C)     Temp Source 05/07/21 0917 Oral     SpO2 05/07/21 0917 100 %     Weight --      Height --      Head Circumference --      Peak Flow --      Pain Score 05/07/21 0912 10     Pain Loc --      Pain Edu? --      Excl. in [place]? --    No data found.  Updated Vital Signs BP 122/86 (BP Location: Right Arm) Comment (BP Location): large cuff  Pulse 91   Temp 98.8 F (37.1 C) (Oral)   Resp (!) 22   LMP 04/22/2021   SpO2 100%   Visual Acuity Right Eye Distance:   Left Eye Distance:   Bilateral Distance:    Right Eye Near:   Left Eye Near:    Bilateral Near:     Physical Exam Vitals reviewed.  Constitutional:      General: She is awake. She is not in acute distress.    Appearance: Normal appearance. She is normal weight. She is not ill-appearing.     Comments: Very pleasant female appears stated age in no acute distress  HENT:     Head: Normocephalic and atraumatic.     Right Ear: Tympanic membrane, ear canal and external ear normal. Tympanic membrane is not erythematous  or bulging.     Left Ear: Tympanic membrane, ear canal and external ear normal. Tympanic membrane is not erythematous or bulging.     Nose:  Right Sinus: No maxillary sinus tenderness or frontal sinus tenderness.     Left Sinus: No maxillary sinus tenderness or frontal sinus tenderness.     Mouth/Throat:     Pharynx: Uvula midline. Posterior oropharyngeal erythema present. No oropharyngeal exudate.     Tonsils: No tonsillar exudate or tonsillar abscesses. 1+ on the right. 1+ on the left.     Comments: Moderate erythema posterior pharynx Cardiovascular:     Rate and Rhythm: Normal rate and regular rhythm.     Heart sounds: Normal heart sounds, S1 normal and S2 normal. No murmur heard. Pulmonary:     Effort: Pulmonary effort is normal.     Breath sounds: Normal breath sounds. No wheezing, rhonchi or rales.     Comments: Clear to auscultation bilaterally Musculoskeletal:     Cervical back: Normal range of motion and neck supple.  Lymphadenopathy:     Head:     Right side of head: No submental, submandibular or tonsillar adenopathy.     Left side of head: No submental, submandibular or tonsillar adenopathy.     Cervical: No cervical adenopathy.  Psychiatric:        Behavior: Behavior is cooperative.     UC Treatments / Results  Labs (all labs ordered are listed, but only abnormal results are displayed) Labs Reviewed  CULTURE, GROUP A STREP [facility])  POCT RAPID STREP A, ED / UC  POC URINE PREG, ED    EKG   Radiology No results found.  Procedures Procedures (including critical care time)  Medications Ordered in UC Medications - No data to display  Initial Impression / Assessment and Plan / UC Course  I have reviewed the triage vital signs and the nursing notes.  Pertinent labs & imaging results that were available during my care of the patient were reviewed by me and considered in my medical decision making (see chart for details).      Rapid strep negative.   Throat culture pending.  Discussed with patient that severe sore throat is likely related to COVID-19 given at home positive test.  Given she is within 5 days of symptoms will start molnupiravir after patient had negative urine pregnancy test today.  Patient has no concern for pregnancy as she had her tubes tied and is planning to undergo hysterectomy later this year.  We discussed side effects with this medication as well as that this is authorized by the FDA for emergency use.  Encouraged use over-the-counter medication for symptom management.  She was given lidocaine solution to help with sore throat symptoms with instruction not to eat or drink immediately after use of this medication to prevent aspiration.  Discussed at length alarm symptoms or warrant emergent evaluation.  Strict return precautions given to which patient expressed understanding.  Final Clinical Impressions(s) / UC Diagnoses   Final diagnoses:  Upper respiratory tract infection, unspecified type  COVID-19  Sore throat     Discharge Instructions      I believe that your sore throat is related to COVID-19.  We will start the oral antiviral as we discussed given you have your tubes tied.  As we discussed, you cannot get pregnant for 6 months after starting this medication and need to use additional contraception.  Most common side effects are diarrhea, nausea, dizziness.  Use viscous lidocaine to help with your symptoms.  Do not eat or drink immediately after using this medication to prevent aspiration or choking.  You can use Tylenol and ibuprofen for additional  symptom relief.  If anything worsens please return for reevaluation as we discussed.     ED Prescriptions     Medication Sig Dispense Auth. Provider   lidocaine (XYLOCAINE) 2 % solution Use as directed 15 mLs in the mouth or throat every 8 (eight) hours as needed for mouth pain. 100 mL Sulay Brymer K, PA-C   molnupiravir EUA 200 mg CAPS Take 4 capsules (800 mg  total) by mouth 2 (two) times daily for 5 days. 40 capsule Shawny Borkowski K, PA-C      PDMP not reviewed this encounter.   Terrilee Croak, PA-C 05/07/21 1030

## 2021-05-07 NOTE — Discharge Instructions (Addendum)
I believe that your sore throat is related to COVID-19.  We will start the oral antiviral as we discussed given you have your tubes tied.  As we discussed, you cannot get pregnant for 6 months after starting this medication and need to use additional contraception.  Most common side effects are diarrhea, nausea, dizziness.  Use viscous lidocaine to help with your symptoms.  Do not eat or drink immediately after using this medication to prevent aspiration or choking.  You can use Tylenol and ibuprofen for additional symptom relief.  If anything worsens please return for reevaluation as we discussed.

## 2021-05-07 NOTE — ED Triage Notes (Signed)
Cough, runny nose, sore throat, sweating episodes, has a positive covid test.  Has taken theraflu, alka seltzer

## 2021-05-09 LAB — CULTURE, GROUP A STREP (THRC)

## 2021-06-01 ENCOUNTER — Other Ambulatory Visit (HOSPITAL_COMMUNITY): Payer: Self-pay

## 2021-06-01 ENCOUNTER — Encounter: Payer: Self-pay | Admitting: Hematology

## 2021-06-04 ENCOUNTER — Telehealth: Payer: 59 | Admitting: Family

## 2021-06-04 DIAGNOSIS — Z8616 Personal history of COVID-19: Secondary | ICD-10-CM

## 2021-06-04 NOTE — Progress Notes (Signed)
    E-Visit  for Positive Covid Test Result  Given you had a history of COVID, you should be out of quarantine 5 days later. I have sent a work note to Doctor, hospital.     You may also take acetaminophen (Tylenol) as needed for fever.  HOME CARE: Only take medications as instructed by your medical team. Drink plenty of fluids and get plenty of rest. A steam or ultrasonic humidifier can help if you have congestion.   GET HELP RIGHT AWAY IF YOU HAVE EMERGENCY WARNING SIGNS.  Call 911 or proceed to your closest emergency facility if: You develop worsening high fever. Trouble breathing Bluish lips or face Persistent pain or pressure in the chest New confusion Inability to wake or stay awake You cough up blood. Your symptoms become more severe Inability to hold down food or fluids  This list is not all possible symptoms. Contact your medical provider for any symptoms that are severe or concerning to you.    Your e-visit answers were reviewed by a board certified advanced clinical practitioner to complete your personal care plan.  Depending on the condition, your plan could have included both over the counter or prescription medications.  If there is a problem please reply once you have received a response from your provider.  Your safety is important to [place].  If you have drug allergies check your prescription carefully.    You can use MyChart to ask questions about today's visit, request a non-urgent call back, or ask for a work or school excuse for 24 hours related to this e-Visit. If it has been greater than 24 hours you will need to follow up with your provider, or enter a new e-Visit to address those concerns. You will get an e-mail in the next two days asking about your experience.  I hope that your e-visit has been valuable and will speed your recovery. Thank you for using e-visits.     Approximately 5 minutes was spent documenting and reviewing patient's chart.

## 2021-06-12 DIAGNOSIS — Z20822 Contact with and (suspected) exposure to covid-19: Secondary | ICD-10-CM | POA: Diagnosis not present

## 2021-07-05 ENCOUNTER — Other Ambulatory Visit: Payer: Self-pay | Admitting: Obstetrics & Gynecology

## 2021-07-19 ENCOUNTER — Other Ambulatory Visit: Payer: Self-pay

## 2021-07-19 ENCOUNTER — Encounter (HOSPITAL_BASED_OUTPATIENT_CLINIC_OR_DEPARTMENT_OTHER): Payer: Self-pay | Admitting: Obstetrics & Gynecology

## 2021-07-19 DIAGNOSIS — Z01812 Encounter for preprocedural laboratory examination: Secondary | ICD-10-CM | POA: Diagnosis not present

## 2021-07-19 NOTE — Progress Notes (Signed)
Your procedure is scheduled on 07-26-2021  Report to [name]   Call this number if you have problems the morning of surgery  :[phone].   OUR ADDRESS IS [address] [place].  WE ARE LOCATED IN [facility].  PLEASE BRING YOUR INSURANCE CARD AND PHOTO ID DAY OF SURGERY.  ONLY ONE PERSON ALLOWED IN FACILITY WAITING AREA.                                     REMEMBER:  DO NOT EAT FOOD, CANDY GUM OR MINTS  AFTER MIDNIGHT . YOU MAY HAVE CLEAR LIQUIDS FROM MIDNIGHT UNTIL 430  AM NO CLEAR LIQUIDS AFTER 430 AM DAY OF SURGERY.   YOU MAY  BRUSH YOUR TEETH MORNING OF SURGERY AND RINSE YOUR MOUTH OUT, NO CHEWING GUM CANDY OR MINTS.    CLEAR LIQUID DIET   Foods Allowed                                                                     Foods Excluded  Coffee and tea, regular and decaf                             liquids that you cannot  Plain Jell-O any favor except red or purple                                           see through such as: Fruit ices (not with fruit pulp)                                     milk, soups, orange juice  Iced Popsicles                                    All solid food Carbonated beverages, regular and diet                                    Cranberry, grape and apple juices Sports drinks like Gatorade Lightly seasoned clear broth or consume(fat free) Sugar  Sample Menu Breakfast                                Lunch                                     Supper Cranberry juice                    Beef broth  Chicken broth Jell-O                                     Grape juice                           Apple juice Coffee or tea                        Jell-O                                      Popsicle                                                Coffee or tea                        Coffee or tea  _____________________________________________________________________     TAKE  THESE MEDICATIONS MORNING OF SURGERY WITH A SIP OF WATER:  _  ONE VISITOR IS ALLOWED IN WAITING ROOM ONLY DAY OF SURGERY.  NO VISITOR MAY SPEND THE NIGHT.  VISITOR ARE ALLOWED TO STAY UNTIL 800 PM.                                    DO NOT WEAR JEWERLY, MAKE UP. DO NOT WEAR LOTIONS, POWDERS, PERFUMES OR NAIL POLISH. DO NOT SHAVE FOR 48 HOURS PRIOR TO DAY OF SURGERY. MEN MAY SHAVE FACE AND NECK. CONTACTS, GLASSES, OR DENTURES MAY NOT BE WORN TO SURGERY.                                    [name] IS NOT RESPONSIBLE  FOR ANY BELONGINGS.                                                                    Marland Kitchen           [name] - Preparing for Surgery Before surgery, you can play an important role.  Because skin is not sterile, your skin needs to be as free of germs as possible.  You can reduce the number of germs on your skin by washing with CHG (chlorahexidine gluconate) soap before surgery.  CHG is an antiseptic cleaner which kills germs and bonds with the skin to continue killing germs even after washing. Please DO NOT use if you have an allergy to CHG or antibacterial soaps.  If your skin becomes reddened/irritated stop using the CHG and inform your nurse when you arrive at Short Stay. Do not shave (including legs and underarms) for at least 48 hours prior to the first CHG shower.  You may shave your face/neck. Please follow these instructions carefully:  1.  Shower with CHG Soap the  night before surgery and the  morning of Surgery.  2.  If you choose to wash your hair, wash your hair first as usual with your  normal  shampoo.  3.  After you shampoo, rinse your hair and body thoroughly to remove the  shampoo.                            4.  Use CHG as you would any other liquid soap.  You can apply chg directly  to the skin and wash                      Gently with a scrungie or clean washcloth.  5.  Apply the CHG Soap to your body ONLY FROM THE NECK DOWN.   Do not use on face/ open                            Wound or open sores. Avoid contact with eyes, ears mouth and genitals (private parts).                       Wash face,  Genitals (private parts) with your normal soap.             6.  Wash thoroughly, paying special attention to the area where your surgery  will be performed.  7.  Thoroughly rinse your body with warm water from the neck down.  8.  DO NOT shower/wash with your normal soap after using and rinsing off  the CHG Soap.                9.  Pat yourself dry with a clean towel.            10.  Wear clean pajamas.            11.  Place clean sheets on your bed the night of your first shower and do not  sleep with pets. Day of Surgery : Do not apply any lotions/deodorants the morning of surgery.  Please wear clean clothes to the hospital/surgery center.  FAILURE TO FOLLOW THESE INSTRUCTIONS MAY RESULT IN THE CANCELLATION OF YOUR SURGERY PATIENT SIGNATURE_________________________________  NURSE SIGNATURE__________________________________  ________________________________________________________________________                                                        QUESTIONS Donna Conley PRE OP NURSE PHONE [phone].

## 2021-07-19 NOTE — Progress Notes (Addendum)
Spoke w/ via phone for pre-op interview---pt Lab needs dos----    urine preg , cbc reordered for dos   specimen clotted per [facility] lab       Lab results------has lab appt 07-24-2021 900 for cbc bmp t & s COVID test -----Positive covid test 05-07-2021 health at work, results on pt chart Arrive at -------530 am 07-26-2021 NPO after MN NO Solid Food.  Clear liquids from MN until---430 am  Med rec completed Medications to take morning of surgery -----none Diabetic medication -----n/a Patient instructed no nail polish to be worn day of surgery Patient instructed to bring photo id and insurance card day of surgery Patient aware to have Driver (ride ) / caregiver cameron spouse    for 24 hours after surgery  Patient Special Instructions -----pt given overnight stay instructions Pre-Op special Istructions -----none Patient verbalized understanding of instructions that were given at this phone interview. Patient denies shortness of breath, chest pain, fever, cough at this phone interview.   Addendum: spoke with c bass mda and reviewed pt history and ekg from 12-15-2020 on chart/epic, do not need to repeat ekg dos and pt ok for lwsc per dr c bass mda/

## 2021-07-24 ENCOUNTER — Other Ambulatory Visit: Payer: Self-pay

## 2021-07-24 ENCOUNTER — Encounter (HOSPITAL_COMMUNITY)
Admission: RE | Admit: 2021-07-24 | Discharge: 2021-07-24 | Disposition: A | Discharge status: Home / Self Care | Payer: 59 | Source: Ambulatory Visit | Attending: Obstetrics & Gynecology | Admitting: Obstetrics & Gynecology

## 2021-07-24 DIAGNOSIS — Z01812 Encounter for preprocedural laboratory examination: Secondary | ICD-10-CM | POA: Diagnosis not present

## 2021-07-24 LAB — BASIC METABOLIC PANEL
Anion gap: 7 (ref 5–15)
BUN: 10 mg/dL (ref 6–20)
CO2: 23 mmol/L (ref 22–32)
Calcium: 9.1 mg/dL (ref 8.9–10.3)
Chloride: 108 mmol/L (ref 98–111)
Creatinine, Ser: 0.71 mg/dL (ref 0.44–1.00)
GFR, Estimated: >60 mL/min (ref >60)
Glucose, Bld: 92 mg/dL (ref 70–99)
Potassium: 4 mmol/L (ref 3.5–5.1)
Sodium: 138 mmol/L (ref 135–145)

## 2021-07-24 LAB — TYPE AND SCREEN
ABO/RH(D): B POS
Antibody Screen: NEGATIVE

## 2021-07-25 NOTE — H&P (Signed)
Donna Conley is an 35 y.o. female.here for hysterectomy due menorrhagia from fibroid uterus. Pt needed IV Iron and saw Hematologist for anemia  35 year old, G2 P2, C-section 2, Lap tubal after 6 mo of 2nd C/s, menorrhagia and dysmenorrhea, small fibroid, failed office endometrial biopsy due to cervical stenosis. Remote abn Pap hx.. Patient declines IUD and ablation option.  Mother had fibroids and eventually needed hysterectomy and patient's PCP and Hematologist are recommending it as well   Last Pap Mar'21- nl and HPV neg Med hx reviewed   Office sono - Jun'22 uterus 11.5x7.8x6.4 cm 217 cc. one small fibroid 2x3.6 cm, SM nl ovaries   Menstrual History: Patient's last menstrual period was 06/14/2021 (exact date).    Past Medical History:  Diagnosis Date   Anemia    Urinary tract infection     Past Surgical History:  Procedure Laterality Date   CESAREAN SECTION  11/12/2007   General Anesthesia   CESAREAN SECTION  09/24/2012   Procedure: CESAREAN SECTION;  Surgeon: Shelly Bombard, MD;  Location: [facility];  Service: Obstetrics;  Laterality: N/A;   LAPAROSCOPIC TUBAL LIGATION  11/20/2012   Procedure: LAPAROSCOPIC TUBAL LIGATION;  Surgeon: Lahoma Crocker, MD;  Location: [facility];  Service: Gynecology;  Laterality: Bilateral;   WRIST SURGERY Right 12/2020   cyst@ [facility]    Family History  Problem Relation Age of Onset   Anemia Mother    Anemia Maternal Grandmother     Social History:  reports that she has never smoked. She has never used smokeless tobacco. She reports that she does not drink alcohol and does not use drugs.  Allergies:  Allergies  Allergen Reactions   Feraheme [Ferumoxytol] Shortness Of Breath    No medications prior to admission.    Review of Systems neg   Height '5\' 2"'$  (1.575 m), weight 103 kg, last menstrual period 06/14/2021. Physical Exam Physical exam:  A&O x 3, no acute distress. Pleasant HEENT neg, no  thyromegaly Lungs CTA bilat CV RRR, S1S2 normal Abdo soft, non tender, non acute Extr no edema/ tenderness Pelvic Narrow pubic arch, cervix pulled up at vag vault, uterus bulky 12 wks    No results found for this or any previous visit (from the past 24 hour(s)).  No results found.  Assessment/Plan: 35 yo G2P2, here for hysterectomy and bilateral salpingectomy with daVinci robot for symptomatic fibroid causing menorrhagia and anemia.  Risks/complications of surgery reviewed incl infection, bleeding, damage to internal organs including bladder, bowels, ureters, blood vessels, other risks from anesthesia, VTE and delayed complications of any surgery, complications in future surgery reviewed. Also discussed neonatal complications incl difficult delivery, laceration, vacuum assistance, TTN etc. Pt understands and agrees, all concerns addressed.     Discussion had with patient extensively with diagrams re. surgery, routes of hysterectomy (abdominal/ vaginal and laparoscopic esp. with Robot assistance to benefit patients). Wants to keep ovaries if within normal limits. Understands will benefit with hormone production after surgery, but has risk of ovarian cancer. No increased risk of ovarian cancer over general population. Risks and complications of surgery reviewed, incl infection, bleeding, damage to bladder, bowel, ureters and blood vessels and need to involve other specialty if that should happen. Discussed post op stay, recovery and activity restrictions in 6 weeks until well healed o/w risk for poor healing, would separation, dehiscence and hernia in future. Need for blood transfusion in case of heavy bleeding or post op severe anemia. Risks of blood borne diseases reviewed. Patient aware/  understands and agrees. All questions and concerns addressed to her satisfaction.   Donna Conley 07/25/2021, 5:22 PM

## 2021-07-25 NOTE — Anesthesia Preprocedure Evaluation (Addendum)
Anesthesia Evaluation  Patient identified by MRN, date of birth, ID band Patient awake    Reviewed: Allergy & Precautions, H&P , NPO status , Patient's Chart, lab work & pertinent test results  History of Anesthesia Complications Negative for: history of anesthetic complications  Airway Mallampati: II  TM Distance: >3 FB Neck ROM: Full    Dental no notable dental hx. (+) Dental Advisory Given, Teeth Intact   Pulmonary neg pulmonary ROS,    Pulmonary exam normal breath sounds clear to auscultation       Cardiovascular Exercise Tolerance: Good negative cardio ROS Normal cardiovascular exam Rhythm:regular Rate:Normal     Neuro/Psych negative neurological ROS  negative psych ROS   GI/Hepatic negative GI ROS, Neg liver ROS,   Endo/Other  Morbid obesity  Renal/GU negative Renal ROS     Musculoskeletal   Abdominal (+) + obese,   Peds  Hematology  (+) Blood dyscrasia, anemia ,   Anesthesia Other Findings   Reproductive/Obstetrics negative OB ROS                            Anesthesia Physical  Anesthesia Plan  ASA: 3  Anesthesia Plan: General   Post-op Pain Management:    Induction: Intravenous  PONV Risk Score and Plan: 4 or greater and Ondansetron, Dexamethasone, Treatment may vary due to age or medical condition, Midazolam, Diphenhydramine and Scopolamine patch - Pre-op  Airway Management Planned: Oral ETT  Additional Equipment: None  Intra-op Plan:   Post-operative Plan: Extubation in OR  Informed Consent: I have reviewed the patients History and Physical, chart, labs and discussed the procedure including the risks, benefits and alternatives for the proposed anesthesia with the patient or authorized representative who has indicated his/her understanding and acceptance.     Dental advisory given  Plan Discussed with: CRNA  Anesthesia Plan Comments:        Anesthesia  Quick Evaluation

## 2021-07-26 ENCOUNTER — Other Ambulatory Visit: Payer: Self-pay

## 2021-07-26 ENCOUNTER — Ambulatory Visit (HOSPITAL_BASED_OUTPATIENT_CLINIC_OR_DEPARTMENT_OTHER): Payer: 59 | Admitting: Anesthesiology

## 2021-07-26 ENCOUNTER — Encounter (HOSPITAL_BASED_OUTPATIENT_CLINIC_OR_DEPARTMENT_OTHER): Payer: Self-pay | Admitting: Obstetrics & Gynecology

## 2021-07-26 ENCOUNTER — Ambulatory Visit (HOSPITAL_BASED_OUTPATIENT_CLINIC_OR_DEPARTMENT_OTHER)
Admission: RE | Admit: 2021-07-26 | Discharge: 2021-07-26 | Disposition: A | Discharge status: Home / Self Care | Payer: 59 | Attending: Obstetrics & Gynecology | Admitting: Obstetrics & Gynecology

## 2021-07-26 ENCOUNTER — Encounter (HOSPITAL_BASED_OUTPATIENT_CLINIC_OR_DEPARTMENT_OTHER)
Admission: RE | Disposition: A | Discharge status: Home / Self Care | Payer: Self-pay | Source: Home / Self Care | Attending: Obstetrics & Gynecology

## 2021-07-26 DIAGNOSIS — Z6841 Body Mass Index (BMI) 40.0 and over, adult: Secondary | ICD-10-CM | POA: Insufficient documentation

## 2021-07-26 DIAGNOSIS — D259 Leiomyoma of uterus, unspecified: Secondary | ICD-10-CM | POA: Diagnosis not present

## 2021-07-26 DIAGNOSIS — Z832 Family history of diseases of the blood and blood-forming organs and certain disorders involving the immune mechanism: Secondary | ICD-10-CM | POA: Diagnosis not present

## 2021-07-26 DIAGNOSIS — N3289 Other specified disorders of bladder: Secondary | ICD-10-CM | POA: Diagnosis not present

## 2021-07-26 DIAGNOSIS — N920 Excessive and frequent menstruation with regular cycle: Secondary | ICD-10-CM | POA: Diagnosis not present

## 2021-07-26 DIAGNOSIS — Z90710 Acquired absence of both cervix and uterus: Secondary | ICD-10-CM | POA: Diagnosis present

## 2021-07-26 DIAGNOSIS — D509 Iron deficiency anemia, unspecified: Secondary | ICD-10-CM | POA: Diagnosis not present

## 2021-07-26 DIAGNOSIS — Q504 Embryonic cyst of fallopian tube: Secondary | ICD-10-CM | POA: Diagnosis not present

## 2021-07-26 DIAGNOSIS — D649 Anemia, unspecified: Secondary | ICD-10-CM | POA: Insufficient documentation

## 2021-07-26 HISTORY — PX: ROBOTIC ASSISTED LAPAROSCOPIC HYSTERECTOMY AND SALPINGECTOMY: SHX6379

## 2021-07-26 HISTORY — DX: Anemia, unspecified: D64.9

## 2021-07-26 LAB — CBC
HCT: 33.7 % — ABNORMAL LOW (ref 36.0–46.0)
Hemoglobin: 11 g/dL — ABNORMAL LOW (ref 12.0–15.0)
MCH: 28.4 pg (ref 26.0–34.0)
MCHC: 32.6 g/dL (ref 30.0–36.0)
MCV: 87.1 fL (ref 80.0–100.0)
Platelets: 328 10*3/uL (ref 150–400)
RBC: 3.87 MIL/uL (ref 3.87–5.11)
RDW: 14.6 % (ref 11.5–15.5)
WBC: 5.4 10*3/uL (ref 4.0–10.5)
nRBC: 0 % (ref 0.0–0.2)

## 2021-07-26 LAB — POCT PREGNANCY, URINE: Preg Test, Ur: NEGATIVE

## 2021-07-26 SURGERY — XI ROBOTIC ASSISTED LAPAROSCOPIC HYSTERECTOMY AND SALPINGECTOMY
Anesthesia: General | Site: Abdomen | Laterality: Bilateral

## 2021-07-26 MED ORDER — LIDOCAINE HCL (PF) 2 % IJ SOLN
INTRAMUSCULAR | Status: AC
  Filled 2021-07-26: qty 15

## 2021-07-26 MED ORDER — PROPOFOL 500 MG/50ML IV EMUL
INTRAVENOUS | Status: DC | PRN
  Administered 2021-07-26: 50 ug/kg/min via INTRAVENOUS

## 2021-07-26 MED ORDER — ACETAMINOPHEN 500 MG PO TABS: 1000.0000 mg | ORAL_TABLET | Freq: Four times a day (QID) | ORAL | 0 refills | Status: AC

## 2021-07-26 MED ORDER — IBUPROFEN 800 MG PO TABS: 800.0000 mg | ORAL_TABLET | Freq: Four times a day (QID) | ORAL | 0 refills | Status: AC | PRN

## 2021-07-26 MED ORDER — KETOROLAC TROMETHAMINE 30 MG/ML IJ SOLN
INTRAMUSCULAR | Status: AC
  Filled 2021-07-26: qty 1

## 2021-07-26 MED ORDER — ONDANSETRON HCL 4 MG PO TABS: 4.0000 mg | ORAL_TABLET | Freq: Four times a day (QID) | ORAL | Status: DC | PRN

## 2021-07-26 MED ORDER — GLYCOPYRROLATE 0.2 MG/ML IJ SOLN
INTRAMUSCULAR | Status: DC | PRN
  Administered 2021-07-26: .1 mg via INTRAVENOUS

## 2021-07-26 MED ORDER — OXYCODONE HCL 5 MG PO TABS
ORAL_TABLET | ORAL | Status: AC
  Filled 2021-07-26: qty 1

## 2021-07-26 MED ORDER — PHENYLEPHRINE HCL (PRESSORS) 10 MG/ML IV SOLN
INTRAVENOUS | Status: DC | PRN
  Administered 2021-07-26 (×4): 80 ug via INTRAVENOUS

## 2021-07-26 MED ORDER — KETOROLAC TROMETHAMINE 30 MG/ML IJ SOLN
30.0000 mg | Freq: Once | INTRAMUSCULAR | Status: AC
  Administered 2021-07-26: 30 mg via INTRAVENOUS

## 2021-07-26 MED ORDER — PROPOFOL 10 MG/ML IV BOLUS
INTRAVENOUS | Status: DC | PRN
  Administered 2021-07-26: 200 mg via INTRAVENOUS

## 2021-07-26 MED ORDER — POVIDONE-IODINE 10 % EX SWAB
2.0000 "application " | Freq: Once | CUTANEOUS | Status: AC
  Administered 2021-07-26: 2 via TOPICAL

## 2021-07-26 MED ORDER — HYDROMORPHONE HCL 1 MG/ML IJ SOLN
INTRAMUSCULAR | Status: AC
  Filled 2021-07-26: qty 1

## 2021-07-26 MED ORDER — HYDROMORPHONE HCL 1 MG/ML IJ SOLN
0.2500 mg | INTRAMUSCULAR | Status: DC | PRN
  Administered 2021-07-26 (×2): 0.5 mg via INTRAVENOUS

## 2021-07-26 MED ORDER — DEXMEDETOMIDINE (PRECEDEX) IN NS 20 MCG/5ML (4 MCG/ML) IV SYRINGE
PREFILLED_SYRINGE | INTRAVENOUS | Status: DC | PRN
  Administered 2021-07-26: 12 ug via INTRAVENOUS

## 2021-07-26 MED ORDER — SUGAMMADEX SODIUM 200 MG/2ML IV SOLN
INTRAVENOUS | Status: DC | PRN
  Administered 2021-07-26: 200 mg via INTRAVENOUS

## 2021-07-26 MED ORDER — ACETAMINOPHEN 500 MG PO TABS
1000.0000 mg | ORAL_TABLET | Freq: Four times a day (QID) | ORAL | Status: DC
  Administered 2021-07-26: 1000 mg via ORAL

## 2021-07-26 MED ORDER — ROCURONIUM BROMIDE 100 MG/10ML IV SOLN
INTRAVENOUS | Status: DC | PRN
  Administered 2021-07-26: 10 mg via INTRAVENOUS
  Administered 2021-07-26: 20 mg via INTRAVENOUS
  Administered 2021-07-26: 10 mg via INTRAVENOUS
  Administered 2021-07-26: 20 mg via INTRAVENOUS
  Administered 2021-07-26: 50 mg via INTRAVENOUS

## 2021-07-26 MED ORDER — SCOPOLAMINE 1 MG/3DAYS TD PT72
1.0000 | MEDICATED_PATCH | TRANSDERMAL | Status: DC
  Administered 2021-07-26: 1.5 mg via TRANSDERMAL

## 2021-07-26 MED ORDER — ONDANSETRON HCL 4 MG/2ML IJ SOLN: 4.0000 mg | Freq: Four times a day (QID) | INTRAMUSCULAR | Status: DC | PRN

## 2021-07-26 MED ORDER — ACETAMINOPHEN 500 MG PO TABS
1000.0000 mg | ORAL_TABLET | Freq: Once | ORAL | Status: AC
  Administered 2021-07-26: 1000 mg via ORAL

## 2021-07-26 MED ORDER — MIDAZOLAM HCL 2 MG/2ML IJ SOLN
INTRAMUSCULAR | Status: AC
  Filled 2021-07-26: qty 2

## 2021-07-26 MED ORDER — IBUPROFEN 800 MG PO TABS: 800.0000 mg | ORAL_TABLET | Freq: Four times a day (QID) | ORAL | Status: DC | PRN

## 2021-07-26 MED ORDER — MENTHOL 3 MG MT LOZG: 1.0000 | LOZENGE | OROMUCOSAL | Status: DC | PRN

## 2021-07-26 MED ORDER — LIDOCAINE HCL (PF) 2 % IJ SOLN
INTRAMUSCULAR | Status: AC
  Filled 2021-07-26: qty 5

## 2021-07-26 MED ORDER — CEFAZOLIN SODIUM-DEXTROSE 2-4 GM/100ML-% IV SOLN
2.0000 g | INTRAVENOUS | Status: AC
  Administered 2021-07-26: 2 g via INTRAVENOUS

## 2021-07-26 MED ORDER — OXYCODONE HCL 5 MG PO TABS: 5.0000 mg | ORAL_TABLET | Freq: Four times a day (QID) | ORAL | 0 refills | Status: DC | PRN

## 2021-07-26 MED ORDER — DEXAMETHASONE SODIUM PHOSPHATE 4 MG/ML IJ SOLN
INTRAMUSCULAR | Status: DC | PRN
  Administered 2021-07-26: 10 mg via INTRAVENOUS

## 2021-07-26 MED ORDER — ROCURONIUM BROMIDE 10 MG/ML (PF) SYRINGE
PREFILLED_SYRINGE | INTRAVENOUS | Status: AC
  Filled 2021-07-26: qty 10

## 2021-07-26 MED ORDER — AMISULPRIDE (ANTIEMETIC) 5 MG/2ML IV SOLN: 10.0000 mg | Freq: Once | INTRAVENOUS | Status: DC | PRN

## 2021-07-26 MED ORDER — DEXAMETHASONE SODIUM PHOSPHATE 10 MG/ML IJ SOLN
INTRAMUSCULAR | Status: AC
  Filled 2021-07-26: qty 1

## 2021-07-26 MED ORDER — SODIUM CHLORIDE 0.9 % IV SOLN
INTRAVENOUS | Status: DC | PRN
  Administered 2021-07-26: 60 mL

## 2021-07-26 MED ORDER — ACETAMINOPHEN 500 MG PO TABS
ORAL_TABLET | ORAL | Status: AC
  Filled 2021-07-26: qty 2

## 2021-07-26 MED ORDER — MEPERIDINE HCL 25 MG/ML IJ SOLN: 6.2500 mg | INTRAMUSCULAR | Status: DC | PRN

## 2021-07-26 MED ORDER — MIDAZOLAM HCL 2 MG/2ML IJ SOLN
INTRAMUSCULAR | Status: DC | PRN
  Administered 2021-07-26: 2 mg via INTRAVENOUS

## 2021-07-26 MED ORDER — PROPOFOL 1000 MG/100ML IV EMUL
INTRAVENOUS | Status: AC
  Filled 2021-07-26: qty 200

## 2021-07-26 MED ORDER — LIDOCAINE HCL (CARDIAC) PF 100 MG/5ML IV SOSY
PREFILLED_SYRINGE | INTRAVENOUS | Status: DC | PRN
  Administered 2021-07-26: 80 mg via INTRAVENOUS

## 2021-07-26 MED ORDER — KETOROLAC TROMETHAMINE 30 MG/ML IJ SOLN
INTRAMUSCULAR | Status: DC | PRN
  Administered 2021-07-26: 30 mg via INTRAVENOUS

## 2021-07-26 MED ORDER — PROPOFOL 10 MG/ML IV BOLUS
INTRAVENOUS | Status: AC
  Filled 2021-07-26: qty 40

## 2021-07-26 MED ORDER — LIDOCAINE HCL (PF) 2 % IJ SOLN
INTRAMUSCULAR | Status: DC | PRN
Start: 2021-07-26 — End: 2021-07-26
  Administered 2021-07-26: 1.5 mg/kg/h via INTRADERMAL

## 2021-07-26 MED ORDER — FENTANYL CITRATE (PF) 250 MCG/5ML IJ SOLN
INTRAMUSCULAR | Status: AC
  Filled 2021-07-26: qty 5

## 2021-07-26 MED ORDER — LACTATED RINGERS IV SOLN: INTRAVENOUS | Status: DC

## 2021-07-26 MED ORDER — SODIUM CHLORIDE 0.9 % IR SOLN
Status: DC | PRN
  Administered 2021-07-26: 1000 mL

## 2021-07-26 MED ORDER — ONDANSETRON HCL 4 MG/2ML IJ SOLN
INTRAMUSCULAR | Status: AC
  Filled 2021-07-26: qty 2

## 2021-07-26 MED ORDER — CEFAZOLIN SODIUM-DEXTROSE 2-4 GM/100ML-% IV SOLN
INTRAVENOUS | Status: AC
  Filled 2021-07-26: qty 100

## 2021-07-26 MED ORDER — GLYCOPYRROLATE PF 0.2 MG/ML IJ SOSY
PREFILLED_SYRINGE | INTRAMUSCULAR | Status: AC
  Filled 2021-07-26: qty 1

## 2021-07-26 MED ORDER — SCOPOLAMINE 1 MG/3DAYS TD PT72
MEDICATED_PATCH | TRANSDERMAL | Status: AC
  Filled 2021-07-26: qty 1

## 2021-07-26 MED ORDER — OXYCODONE HCL 5 MG PO TABS
5.0000 mg | ORAL_TABLET | Freq: Four times a day (QID) | ORAL | Status: DC | PRN
  Administered 2021-07-26: 5 mg via ORAL

## 2021-07-26 MED ORDER — PHENYLEPHRINE 40 MCG/ML (10ML) SYRINGE FOR IV PUSH (FOR BLOOD PRESSURE SUPPORT)
PREFILLED_SYRINGE | INTRAVENOUS | Status: AC
  Filled 2021-07-26: qty 10

## 2021-07-26 MED ORDER — FENTANYL CITRATE (PF) 100 MCG/2ML IJ SOLN
INTRAMUSCULAR | Status: DC | PRN
  Administered 2021-07-26 (×2): 25 ug via INTRAVENOUS
  Administered 2021-07-26 (×3): 50 ug via INTRAVENOUS

## 2021-07-26 SURGICAL SUPPLY — 64 items
ADH SKN CLS APL DERMABOND .7 (GAUZE/BANDAGES/DRESSINGS) ×1
APL SRG 38 LTWT LNG FL B (MISCELLANEOUS) ×1
APPLICATOR ARISTA FLEXITIP XL (MISCELLANEOUS) ×2 IMPLANT
BARRIER ADHS 3X4 INTERCEED (GAUZE/BANDAGES/DRESSINGS) IMPLANT
BRR ADH 4X3 ABS CNTRL BYND (GAUZE/BANDAGES/DRESSINGS)
CATH FOLEY 3WAY  5CC 16FR (CATHETERS) ×2
CATH FOLEY 3WAY 5CC 16FR (CATHETERS) ×1 IMPLANT
COVER BACK TABLE 60X90IN (DRAPES) ×2 IMPLANT
COVER TIP SHEARS 8 DVNC (MISCELLANEOUS) ×1 IMPLANT
COVER TIP SHEARS 8MM DA VINCI (MISCELLANEOUS) ×2
DECANTER SPIKE VIAL GLASS SM (MISCELLANEOUS) ×4 IMPLANT
DEFOGGER SCOPE WARMER CLEARIFY (MISCELLANEOUS) ×2 IMPLANT
DERMABOND ADVANCED (GAUZE/BANDAGES/DRESSINGS) ×1
DERMABOND ADVANCED .7 DNX12 (GAUZE/BANDAGES/DRESSINGS) ×1 IMPLANT
DRAPE ARM DVNC X/XI (DISPOSABLE) ×4 IMPLANT
DRAPE COLUMN DVNC XI (DISPOSABLE) ×1 IMPLANT
DRAPE DA VINCI XI ARM (DISPOSABLE) ×8
DRAPE DA VINCI XI COLUMN (DISPOSABLE) ×2
DRAPE UTILITY XL STRL (DRAPES) ×2 IMPLANT
DURAPREP 26ML APPLICATOR (WOUND CARE) ×2 IMPLANT
ELECT REM PT RETURN 9FT ADLT (ELECTROSURGICAL) ×2
ELECTRODE REM PT RTRN 9FT ADLT (ELECTROSURGICAL) ×1 IMPLANT
GAUZE 4X4 16PLY ~~LOC~~+RFID DBL (SPONGE) ×6 IMPLANT
GAUZE PETROLATUM 1 X8 (GAUZE/BANDAGES/DRESSINGS) IMPLANT
GLOVE SURG ENC MOIS LTX SZ7 (GLOVE) ×6 IMPLANT
GLOVE SURG UNDER POLY LF SZ7 (GLOVE) ×10 IMPLANT
HEMOSTAT ARISTA ABSORB 3G PWDR (HEMOSTASIS) ×2 IMPLANT
HOLDER FOLEY CATH W/STRAP (MISCELLANEOUS) IMPLANT
IRRIG SUCT STRYKERFLOW 2 WTIP (MISCELLANEOUS) ×2
IRRIGATION SUCT STRKRFLW 2 WTP (MISCELLANEOUS) ×1 IMPLANT
KIT TURNOVER CYSTO (KITS) ×2 IMPLANT
LEGGING LITHOTOMY PAIR STRL (DRAPES) ×2 IMPLANT
OBTURATOR OPTICAL STANDARD 8MM (TROCAR) ×2
OBTURATOR OPTICAL STND 8 DVNC (TROCAR) ×1
OBTURATOR OPTICALSTD 8 DVNC (TROCAR) ×1 IMPLANT
OCCLUDER COLPOPNEUMO (BALLOONS) ×2 IMPLANT
PACK ROBOT WH (CUSTOM PROCEDURE TRAY) ×2 IMPLANT
PACK ROBOTIC GOWN (GOWN DISPOSABLE) ×2 IMPLANT
PACK TRENDGUARD 450 HYBRID PRO (MISCELLANEOUS) ×1 IMPLANT
PAD OB MATERNITY 4.3X12.25 (PERSONAL CARE ITEMS) ×2 IMPLANT
PAD PREP 24X48 CUFFED NSTRL (MISCELLANEOUS) ×2 IMPLANT
PROTECTOR NERVE ULNAR (MISCELLANEOUS) ×2 IMPLANT
SEAL CANN UNIV 5-8 DVNC XI (MISCELLANEOUS) ×4 IMPLANT
SEAL XI 5MM-8MM UNIVERSAL (MISCELLANEOUS) ×8
SEALER VESSEL DA VINCI XI (MISCELLANEOUS) ×2
SEALER VESSEL EXT DVNC XI (MISCELLANEOUS) ×1 IMPLANT
SET IRRIG Y TYPE TUR BLADDER L (SET/KITS/TRAYS/PACK) ×2 IMPLANT
SET TRI-LUMEN FLTR TB AIRSEAL (TUBING) ×2 IMPLANT
SPONGE T-LAP 4X18 ~~LOC~~+RFID (SPONGE) IMPLANT
SUT VIC AB 4-0 PS2 18 (SUTURE) ×4 IMPLANT
SUT VICRYL 0 UR6 27IN ABS (SUTURE) ×2 IMPLANT
SUT VLOC 180 0 9IN  GS21 (SUTURE) ×2
SUT VLOC 180 0 9IN GS21 (SUTURE) ×1 IMPLANT
TIP RUMI ORANGE 6.7MMX12CM (TIP) IMPLANT
TIP UTERINE 5.1X6CM LAV DISP (MISCELLANEOUS) IMPLANT
TIP UTERINE 6.7X10CM GRN DISP (MISCELLANEOUS) ×2 IMPLANT
TIP UTERINE 6.7X6CM WHT DISP (MISCELLANEOUS) IMPLANT
TIP UTERINE 6.7X8CM BLUE DISP (MISCELLANEOUS) IMPLANT
TOWEL OR 17X26 10 PK STRL BLUE (TOWEL DISPOSABLE) ×2 IMPLANT
TRAY FOL W/BAG SLVR 16FR STRL (SET/KITS/TRAYS/PACK) IMPLANT
TRAY FOLEY W/BAG SLVR 16FR LF (SET/KITS/TRAYS/PACK)
TRENDGUARD 450 HYBRID PRO PACK (MISCELLANEOUS) ×2
TROCAR PORT AIRSEAL 5X120 (TROCAR) ×2 IMPLANT
WATER STERILE IRR 1000ML POUR (IV SOLUTION) IMPLANT

## 2021-07-26 NOTE — Anesthesia Postprocedure Evaluation (Signed)
Anesthesia Post Note  Patient: Donna Conley  Procedure(s) Performed: XI ROBOTIC ASSISTED LAPAROSCOPIC HYSTERECTOMY AND SALPINGECTOMY (Bilateral: Abdomen)     Patient location during evaluation: PACU Anesthesia Type: General Level of consciousness: sedated and patient cooperative Pain management: pain level controlled Vital Signs Assessment: post-procedure vital signs reviewed and stable Respiratory status: spontaneous breathing Cardiovascular status: stable Anesthetic complications: no   No notable events documented.  Last Vitals:  Vitals:   07/26/21 1230 07/26/21 1302  BP: (!) 144/89 133/84  Pulse: 81 (!) 102  Resp: 14 16  Temp: 36.8 C   SpO2: 100% 99%    Last Pain:  Vitals:   07/26/21 1230  TempSrc:   PainSc: [place]

## 2021-07-26 NOTE — Progress Notes (Signed)
07/26/2021 5:55 PM Upon discharge and assisting patient into vehicle, pt. Started to c/o severe gas pain in right shoulder and neck. Pt. Tearful, states pain is severe. Pt. Repositioned in vehicle and heat packs offered to site of pain. RN offered to bring patient back into [facility] for further evaluation but patient declined. She stated she "did not want to get back out of the car" and "just wanted to go home". RN encouraged patient to stay for further evaluation. Pt. And spouse decided to go get pt. Medications from [facility] and go home. RN advised patient that RN from [facility] would call and check on her within the hour but if she had any further problems to reach out to Dr. Gardiner Coins office. Verbalized understanding.  Roneshia Drew, Arville Lime

## 2021-07-26 NOTE — Transfer of Care (Signed)
Immediate Anesthesia Transfer of Care Note  Patient: Donna Conley  Procedure(s) Performed: XI ROBOTIC ASSISTED LAPAROSCOPIC HYSTERECTOMY AND SALPINGECTOMY (Bilateral: Abdomen)  Patient Location: PACU  Anesthesia Type:General  Level of Consciousness: awake, alert , oriented and patient cooperative  Airway & Oxygen Therapy: Patient Spontanous Breathing and Patient connected to nasal cannula oxygen  Post-op Assessment: Report given to RN and Post -op Vital signs reviewed and stable  Post vital signs: Reviewed and stable  Last Vitals:  Vitals Value Taken Time  BP 145/96 07/26/21 1109  Temp    Pulse 95 07/26/21 1114  Resp 17 07/26/21 1114  SpO2 100 % 07/26/21 1114  Vitals shown include unvalidated device data.  Last Pain:  Vitals:   07/26/21 0603  TempSrc: Oral  PainSc: 0-No pain         Complications: No notable events documented.

## 2021-07-26 NOTE — Progress Notes (Signed)
07/26/2021 6:40 PM Upon call to check up on patient, she states pain is much better. She is at the pharmacy currently awaiting her prescriptions. RN advised to take prn pain meds as directed and notify MD if any concerns arise. Verbalized understanding.  Deniel Mcquiston, Arville Lime

## 2021-07-26 NOTE — Op Note (Signed)
07/26/2021 Donna Conley   Preoperative diagnosis:  Menorrhagia, anemia, fibroid uterus Postop diagnosis: Same Procedure: da Vinci robot assisted total laparoscopic hysterectomy and bilateral salpingectomy, extensive lysis of bladder adhesions  Anesthesia Gen. Endotracheal Surgeon: Azucena Fallen MD Assistant: Aloha Gell, MD IV fluids: 900 cc LR EBL: 100 cc Urine output: 50 cc, clear in foley Complications: none Pathology: Uterus with cervix and partial fallopian tubes Disposition: PACU, stable Findings: Extensive anterior wall of the uterus to bladder adhesions, enlarged uterus 10 weeks, normal ovaries,  Bilateral fimbria of the tubes noted. normal bilateral ureters.   Procedure:  Indication: -- Menorrhagia, anemia, enlarged uterus at 10 weeks' size.  Patient declined medical management.  Hysterectomy was discussed and planned, complications of surgery including infection, bleeding, damage to internal organs and other surgery related problems including pneumonia, VTE reviewed and informed written consent was obtained. Ovarian sparing discussed due to age. She understood and gave informed written consent.  Patient was brought to the operating room with IV running. She received 2 g Ancef. . Underwent general anesthesia without difficulty and was given dorsal lithotomy position, prepped and draped in sterile fashion. Foley catheter was placed. Cervix was noted under pubic symphysis, exposed with a speculum and anterior lip of the cervix was grasped with tenaculum. Uterus was retroverted, Cervical os was stenotic, uterus was sounded to 11 cm. A  # 10 Rumi tip and a small Koh ring was assembled on the [employer] and entered in the uterine cavity and balloon was inflated to secure it in place. Koh ring was palpated again cervico-vaginal junction. Speculum was removed, tenaculum was left on the cervix through the [facility] ring   Attention was focused on abdomen. Supraumbilical 12 mm vertical  incision made with scalpel after injecting Marcaine, fascia dissected, grasped with Kocher's and incised, posterior rectus sheath and peritoneum grasped, incised, intraabdominal entry confirmed. Purse string stay stitch on 0-Vicryl taken on fascia and Hassan cannula introduced and Vicryl sutures secured on the Hassan cannula. Pneumoperitoneum was begun. Laparoscope was introduced and the peritoneal cavity was evaluated. There was no evidence of adhesions to abdominal wall . Trendelenburg position given. Port sited marked and injected with Marcaine. For ports inserted, 3 robotic cannulas and 1 airseal. The robot was docked from the right in usual fashion. Arm 1 had vessel sealer, arm 2 camera arm 3 and a scissors and arm 4 prograsp. I scrubbed out and went to surgical console.  Uterus was deviated to the patient's right. The left segment of fallopian tube was Cauterized and excised ligament was desiccated and cut And passed off.  The round ligament was grasped desiccated and cut followed by Left utero-ovarian ligament that was desiccated and cut. Anterior bladder broad ligament was opened and incised minimally due to dense bladder adhesions.  Posterior broad ligament incised up to the uterosacral ligament  Uterus was deviated to the left and right fallopian tube was desiccated and incised and passed off. Then right round ligament and utero-ovarian ligament desiccated and cut. Anterior broad ligament was incised  to some extent laterally.  Bladder was retrofilled with saline, bladder edge was evaluated after 150 cc of saline was instilled, dissection was continued to stay close to the uterus and dissected bladder away from the adhesions.  Dr. Beatrix Fetters, urologist present in the operating rooms was requested  to step in to assess bladder health and he said  appeared normal. Bilateral uterine vessels were exposed better.  Both the uterine vessels were desiccated above the level of Koh ring  and cut. Koh ring  impression at cervicovaginal junction was seen well. Vaginal occluder was inflated. Colpotomy was begun starting from midline anteriorly coming to the left and right and then circumferentially staying above the uterosacral ligaments posteriorly. Uterus, cervix were pulled out of the vaginal opening and vaginal occluder placed back to maintain pneumoperitoneum.  Vaginal cut edges were evaluated for hemostasis which was excellent. Irrigation was performed pedicles appeared dry. Robotic instruments switched for needle driver and long tip tissue forceps. V-Lock 0 was used to close the cuff in running fashion. Irrigation was performed, all pedicles appeared to be hemostatic. Robotic instruments were removed. Robot undocked. Patient was made supine. Lap'scope was reintroduced, hemostasis was excellent. Liver surface appeared normal.  Robotic cannulas were removed under vision. Laparoscope and central port removed under vision. The stay sutures at the fascia tied together with excellent fascial closure. Skin approximated with subcuticular stitches on 4-0 Vicryl. Dermabond was applied. No vaginal bleeding noted on vaginal exam at the end. All instruments/lap/sponges counts were correct x2.  No complications. Patient tolerated procedure well and was reversed from anesthesia and brought to the PACU stable condition. Foley catheter to be removed in PACU.  Dr Benjie Karvonen was the surgeon for entire case.   Criss Alvine Ameris Akamine MD

## 2021-07-26 NOTE — Anesthesia Procedure Notes (Signed)
Procedure Name: Intubation Date/Time: 07/26/2021 7:47 AM Performed by: Georgeanne Nim, CRNA Pre-anesthesia Checklist: Patient identified, Emergency Drugs available, Suction available, Patient being monitored and Timeout performed Patient Re-evaluated:Patient Re-evaluated prior to induction Oxygen Delivery Method: Circle system utilized Preoxygenation: Pre-oxygenation with 100% oxygen Induction Type: IV induction Ventilation: Mask ventilation without difficulty Laryngoscope Size: Mac and 4 Grade View: Grade I Tube size: 7.0 mm Number of attempts: 1 Airway Equipment and Method: Stylet Placement Confirmation: ETT inserted through vocal cords under direct vision, positive ETCO2, CO2 detector and breath sounds checked- equal and bilateral Secured at: 20 cm Tube secured with: Tape Dental Injury: Teeth and Oropharynx as per pre-operative assessment

## 2021-07-27 ENCOUNTER — Encounter (HOSPITAL_BASED_OUTPATIENT_CLINIC_OR_DEPARTMENT_OTHER): Payer: Self-pay | Admitting: Obstetrics & Gynecology

## 2021-07-27 LAB — SURGICAL PATHOLOGY

## 2021-08-06 ENCOUNTER — Encounter (HOSPITAL_BASED_OUTPATIENT_CLINIC_OR_DEPARTMENT_OTHER): Payer: Self-pay | Admitting: Obstetrics & Gynecology

## 2021-08-06 NOTE — Addendum Note (Signed)
Addendum  created 08/06/21 0944 by Nolon Nations, MD   Intraprocedure Event edited, Intraprocedure Staff edited

## 2021-08-13 ENCOUNTER — Other Ambulatory Visit (HOSPITAL_COMMUNITY): Payer: Self-pay

## 2021-08-13 MED ORDER — VALACYCLOVIR HCL 500 MG PO TABS
ORAL_TABLET | ORAL | 1 refills | Status: AC
  Filled 2021-08-13: qty 30, 15d supply, fill #0

## 2021-09-03 ENCOUNTER — Inpatient Hospital Stay: Payer: 59 | Admitting: Hematology

## 2021-09-17 ENCOUNTER — Ambulatory Visit: Payer: 59 | Admitting: Hematology

## 2021-09-21 ENCOUNTER — Inpatient Hospital Stay: Payer: 59 | Attending: Hematology | Admitting: Hematology

## 2021-09-21 ENCOUNTER — Inpatient Hospital Stay: Payer: 59

## 2021-09-21 ENCOUNTER — Other Ambulatory Visit: Payer: Self-pay

## 2021-09-21 ENCOUNTER — Encounter: Payer: Self-pay | Admitting: Hematology

## 2021-09-21 VITALS — BP 136/87 | HR 85 | Temp 98.1°F | Resp 17 | Ht 62.0 in | Wt 231.6 lb

## 2021-09-21 DIAGNOSIS — D509 Iron deficiency anemia, unspecified: Secondary | ICD-10-CM

## 2021-09-21 DIAGNOSIS — Z9079 Acquired absence of other genital organ(s): Secondary | ICD-10-CM | POA: Diagnosis not present

## 2021-09-21 DIAGNOSIS — D508 Other iron deficiency anemias: Secondary | ICD-10-CM

## 2021-09-21 DIAGNOSIS — Z90710 Acquired absence of both cervix and uterus: Secondary | ICD-10-CM | POA: Insufficient documentation

## 2021-09-21 LAB — CBC WITH DIFFERENTIAL (CANCER CENTER ONLY)
Abs Immature Granulocytes: 0.01 10*3/uL (ref 0.00–0.07)
Basophils Absolute: 0.1 10*3/uL (ref 0.0–0.1)
Basophils Relative: 1 %
Eosinophils Absolute: 0.4 10*3/uL (ref 0.0–0.5)
Eosinophils Relative: 7 %
HCT: 33.8 % — ABNORMAL LOW (ref 36.0–46.0)
Hemoglobin: 11.1 g/dL — ABNORMAL LOW (ref 12.0–15.0)
Immature Granulocytes: 0 %
Lymphocytes Relative: 32 %
Lymphs Abs: 2.1 10*3/uL (ref 0.7–4.0)
MCH: 27.4 pg (ref 26.0–34.0)
MCHC: 32.8 g/dL (ref 30.0–36.0)
MCV: 83.5 fL (ref 80.0–100.0)
Monocytes Absolute: 0.4 10*3/uL (ref 0.1–1.0)
Monocytes Relative: 6 %
Neutro Abs: 3.5 10*3/uL (ref 1.7–7.7)
Neutrophils Relative %: 54 %
Platelet Count: 313 10*3/uL (ref 150–400)
RBC: 4.05 MIL/uL (ref 3.87–5.11)
RDW: 14.5 % (ref 11.5–15.5)
WBC Count: 6.5 10*3/uL (ref 4.0–10.5)
nRBC: 0 % (ref 0.0–0.2)

## 2021-09-21 NOTE — Progress Notes (Signed)
[place]   Telephone:(336) [phone] Fax:(336) [phone]   Clinic Follow up Note   Patient Care Team: Antony Contras, MD as PCP - General (Family Medicine) Truitt Merle, MD as Consulting Physician (Hematology)  Date of Service:  09/21/2021  CHIEF COMPLAINT: f/u of iron deficient anemia  CURRENT THERAPY:  IV Iron as need, if ferritin <30  ASSESSMENT & PLAN:  Donna Conley is a 35 y.o. female with   1. 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 She did have a normal CBC with normal MCV in 2009  -Her initial outside labs from her PCP was found to have abnormal CBC from Feb and March 2019 with Hgb of 9.2 and 9.9, low ferritin, low HCT, low MCV, low MCH, and high RDW, which is consistent with iron deficient anemia. She does not have overt GI bleeding that would cause her to lose blood.   Her iron deficient anemia is probably from menorrhagia -She was on Oral ferrous sulfate 325mg  once daily but IDA did not improve much. She is taking a prenatal vitamin. -I started her on IV iron, first dose Feraheme, subsequently changed to Venofer due to possible allergy reactions (nausea and SOB).  She tolerated better -she underwent hysterectomy with salpingectomy on 07/26/21. I anticipate her anemia will resolve after surgery  -she did not have lab work done today, and her last lab work was done with her surgery. I will order repeat for today and in 3 months. -Follow-up in 6 months, if anemia resolves, plan to discharge her after next visit     PLAN:  -lab today and in 3 and 6 months -f/u in 6 months   No problem-specific Assessment & Plan notes found for this encounter.   INTERVAL HISTORY:  Donna Conley is here for a follow up of anemia. She was last seen by me on 12/06/20 and in [facility] 12/15/20. She presents to the clinic alone. She reports she has recovered from her hysterectomy well. She  notes she is feeling well and feels she has more energy.   All other systems were reviewed with the patient and are negative.  MEDICAL HISTORY:  Past Medical History:  Diagnosis Date   Anemia    Urinary tract infection     SURGICAL HISTORY: Past Surgical History:  Procedure Laterality Date   CESAREAN SECTION  11/12/2007   General Anesthesia   CESAREAN SECTION  09/24/2012   Procedure: CESAREAN SECTION;  Surgeon: Shelly Bombard, MD;  Location: [facility];  Service: Obstetrics;  Laterality: N/A;   LAPAROSCOPIC TUBAL LIGATION  11/20/2012   Procedure: LAPAROSCOPIC TUBAL LIGATION;  Surgeon: Lahoma Crocker, MD;  Location: [facility];  Service: Gynecology;  Laterality: Bilateral;   ROBOTIC ASSISTED LAPAROSCOPIC HYSTERECTOMY AND SALPINGECTOMY Bilateral 07/26/2021   Procedure: XI ROBOTIC ASSISTED LAPAROSCOPIC HYSTERECTOMY AND SALPINGECTOMY;  Surgeon: Azucena Fallen, MD;  Location: [facility];  Service: Gynecology;  Laterality: Bilateral;  Requests 3hrs.   WRIST SURGERY Right 12/2020   cyst@ [facility]    I have reviewed the social history and family history with the patient and they are unchanged from previous note.  ALLERGIES:  is allergic to feraheme [ferumoxytol].  MEDICATIONS:  Current Outpatient Medications  Medication Sig Dispense Refill   acetaminophen (TYLENOL) 500 MG tablet Take 2 tablets (1,000 mg total) by mouth every 6 (six) hours. 30 tablet 0   fluticasone (FLONASE) 50 MCG/ACT nasal spray Place  2 sprays into both nostrils daily. (Patient taking differently: Place 2 sprays into both nostrils as needed.) 16 g 6   ibuprofen (ADVIL) 800 MG tablet Take 1 tablet (800 mg total) by mouth every 6 (six) hours as needed for moderate pain. 30 tablet 0   oxyCODONE (OXY IR/ROXICODONE) 5 MG immediate release tablet Take 1 tablet (5 mg total) by mouth every 6 (six) hours as needed for moderate pain. 30 tablet 0   valACYclovir (VALTREX) 500 MG tablet Takeo one  tablet by mouth twice daily. 30 tablet 1   Vitamin D, Ergocalciferol, (DRISDOL) 50000 units CAPS capsule Take 1 capsule by mouth once a week. sunday     No current facility-administered medications for this visit.    PHYSICAL EXAMINATION: ECOG PERFORMANCE STATUS: 1 - Symptomatic but completely ambulatory  Vitals:   09/21/21 1515  BP: 136/87  Pulse: 85  Resp: 17  Temp: 98.1 F (36.7 C)  SpO2: 100%   Wt Readings from Last 3 Encounters:  09/21/21 231 lb 9.6 oz (105.1 kg)  07/26/21 225 lb 14.4 oz (102.5 kg)  07/24/21 225 lb (102.1 kg)     GENERAL:alert, no distress and comfortable SKIN: skin color normal, no rashes or significant lesions EYES: normal, Conjunctiva are pink and non-injected, sclera clear  NEURO: alert & oriented x 3 with fluent speech  LABORATORY DATA:  I have reviewed the data as listed CBC Latest Ref Rng & Units 09/21/2021 07/26/2021 03/05/2021  WBC 4.0 - 10.5 K/uL 6.5 5.4 4.6  Hemoglobin 12.0 - 15.0 g/dL 11.1(L) 11.0(L) 11.6(L)  Hematocrit 36.0 - 46.0 % 33.8(L) 33.7(L) 34.5(L)  Platelets 150 - 400 K/uL 313 328 289     CMP Latest Ref Rng & Units 07/24/2021 12/18/2013 04/21/2012  Glucose 70 - 99 mg/dL 92 99 85  BUN 6 - 20 mg/dL 10 8 7   Creatinine 0.44 - 1.00 mg/dL 0.71 0.80 0.66  Sodium 135 - 145 mmol/L 138 139 133(L)  Potassium 3.5 - 5.1 mmol/L 4.0 3.6(L) 3.8  Chloride 98 - 111 mmol/L 108 104 102  CO2 22 - 32 mmol/L 23 23 23   Calcium 8.9 - 10.3 mg/dL 9.1 9.4 9.1  Total Protein 6.0 - 8.3 g/dL - 7.8 6.6  Total Bilirubin 0.3 - 1.2 mg/dL - 0.5 0.2(L)  Alkaline Phos 39 - 117 U/L - 49 37(L)  AST 0 - 37 U/L - 18 16  ALT 0 - 35 U/L - 14 20      RADIOGRAPHIC STUDIES: I have personally reviewed the radiological images as listed and agreed with the findings in the report. No results found.    No orders of the defined types were placed in this encounter.  All questions were answered. The patient knows to call the clinic with any problems, questions or  concerns. No barriers to learning was detected. The total time spent in the appointment was 15 minutes.     Truitt Merle, MD 09/21/2021   I, Wilburn Mylar, am acting as scribe for Truitt Merle, MD.   I have reviewed the above documentation for accuracy and completeness, and I agree with the above.

## 2021-09-24 ENCOUNTER — Telehealth: Payer: Self-pay | Admitting: Hematology

## 2021-09-24 LAB — IRON AND TIBC
Iron: 45 ug/dL (ref 41–142)
Saturation Ratios: 13 % — ABNORMAL LOW (ref 21–57)
TIBC: 360 ug/dL (ref 236–444)
UIBC: 315 ug/dL (ref 120–384)

## 2021-09-24 LAB — FERRITIN: Ferritin: 34 ng/mL (ref 11–307)

## 2021-09-24 NOTE — Telephone Encounter (Signed)
Left message with follow-up appointments per 11/11 los.

## 2021-09-30 ENCOUNTER — Encounter: Payer: Self-pay | Admitting: Hematology

## 2021-10-12 DIAGNOSIS — H5213 Myopia, bilateral: Secondary | ICD-10-CM | POA: Diagnosis not present

## 2021-12-19 ENCOUNTER — Telehealth: Payer: Self-pay | Admitting: Hematology

## 2021-12-19 NOTE — Telephone Encounter (Signed)
Rescheduled upcoming appointment per patient's request. Patient is aware of changes. 

## 2021-12-24 ENCOUNTER — Other Ambulatory Visit: Payer: Self-pay

## 2021-12-24 ENCOUNTER — Inpatient Hospital Stay: Payer: 59 | Attending: Hematology

## 2021-12-24 ENCOUNTER — Encounter: Payer: Self-pay | Admitting: Hematology

## 2021-12-24 DIAGNOSIS — D509 Iron deficiency anemia, unspecified: Secondary | ICD-10-CM | POA: Insufficient documentation

## 2021-12-24 LAB — IRON AND IRON BINDING CAPACITY (CC-WL,HP ONLY)
Iron: 47 ug/dL (ref 28–170)
Saturation Ratios: 12 % (ref 10.4–31.8)
TIBC: 391 ug/dL (ref 250–450)
UIBC: 344 ug/dL (ref 148–442)

## 2021-12-24 LAB — CBC WITH DIFFERENTIAL (CANCER CENTER ONLY)
Abs Immature Granulocytes: 0 10*3/uL (ref 0.00–0.07)
Basophils Absolute: 0 10*3/uL (ref 0.0–0.1)
Basophils Relative: 1 %
Eosinophils Absolute: 0.7 10*3/uL — ABNORMAL HIGH (ref 0.0–0.5)
Eosinophils Relative: 13 %
HCT: 34.5 % — ABNORMAL LOW (ref 36.0–46.0)
Hemoglobin: 11.6 g/dL — ABNORMAL LOW (ref 12.0–15.0)
Immature Granulocytes: 0 %
Lymphocytes Relative: 33 %
Lymphs Abs: 1.7 10*3/uL (ref 0.7–4.0)
MCH: 28.4 pg (ref 26.0–34.0)
MCHC: 33.6 g/dL (ref 30.0–36.0)
MCV: 84.6 fL (ref 80.0–100.0)
Monocytes Absolute: 0.3 10*3/uL (ref 0.1–1.0)
Monocytes Relative: 6 %
Neutro Abs: 2.4 10*3/uL (ref 1.7–7.7)
Neutrophils Relative %: 47 %
Platelet Count: 316 10*3/uL (ref 150–400)
RBC: 4.08 MIL/uL (ref 3.87–5.11)
RDW: 14.3 % (ref 11.5–15.5)
WBC Count: 5.1 10*3/uL (ref 4.0–10.5)
nRBC: 0 % (ref 0.0–0.2)

## 2021-12-24 LAB — FERRITIN: Ferritin: 42 ng/mL (ref 11–307)

## 2021-12-24 NOTE — Progress Notes (Signed)
Please let pt know her anemia has improved, iron level adequate, no need for iv iron now, thanks  Donna Conley

## 2022-03-21 ENCOUNTER — Ambulatory Visit: Payer: 59 | Admitting: Hematology

## 2022-03-21 ENCOUNTER — Other Ambulatory Visit: Payer: 59

## 2022-04-23 ENCOUNTER — Other Ambulatory Visit: Payer: Self-pay

## 2022-04-23 DIAGNOSIS — D509 Iron deficiency anemia, unspecified: Secondary | ICD-10-CM

## 2022-04-23 DIAGNOSIS — D500 Iron deficiency anemia secondary to blood loss (chronic): Secondary | ICD-10-CM

## 2022-04-23 DIAGNOSIS — D508 Other iron deficiency anemias: Secondary | ICD-10-CM

## 2022-04-25 ENCOUNTER — Encounter: Payer: Self-pay | Admitting: Hematology

## 2022-04-25 ENCOUNTER — Other Ambulatory Visit: Payer: Self-pay

## 2022-04-25 ENCOUNTER — Telehealth: Payer: Self-pay

## 2022-04-25 ENCOUNTER — Inpatient Hospital Stay: Payer: 59 | Attending: Hematology

## 2022-04-25 ENCOUNTER — Inpatient Hospital Stay (HOSPITAL_BASED_OUTPATIENT_CLINIC_OR_DEPARTMENT_OTHER): Payer: 59 | Admitting: Hematology

## 2022-04-25 VITALS — BP 137/98 | HR 99 | Temp 98.2°F | Resp 17 | Ht 62.0 in | Wt 215.1 lb

## 2022-04-25 DIAGNOSIS — D508 Other iron deficiency anemias: Secondary | ICD-10-CM | POA: Diagnosis not present

## 2022-04-25 DIAGNOSIS — Z9079 Acquired absence of other genital organ(s): Secondary | ICD-10-CM | POA: Diagnosis not present

## 2022-04-25 DIAGNOSIS — D509 Iron deficiency anemia, unspecified: Secondary | ICD-10-CM | POA: Diagnosis not present

## 2022-04-25 DIAGNOSIS — Z90710 Acquired absence of both cervix and uterus: Secondary | ICD-10-CM | POA: Insufficient documentation

## 2022-04-25 DIAGNOSIS — Z1159 Encounter for screening for other viral diseases: Secondary | ICD-10-CM | POA: Diagnosis not present

## 2022-04-25 DIAGNOSIS — Z0000 Encounter for general adult medical examination without abnormal findings: Secondary | ICD-10-CM | POA: Diagnosis not present

## 2022-04-25 DIAGNOSIS — Z114 Encounter for screening for human immunodeficiency virus [HIV]: Secondary | ICD-10-CM | POA: Diagnosis not present

## 2022-04-25 DIAGNOSIS — E559 Vitamin D deficiency, unspecified: Secondary | ICD-10-CM | POA: Diagnosis not present

## 2022-04-25 DIAGNOSIS — D500 Iron deficiency anemia secondary to blood loss (chronic): Secondary | ICD-10-CM

## 2022-04-25 DIAGNOSIS — Z6836 Body mass index (BMI) 36.0-36.9, adult: Secondary | ICD-10-CM | POA: Diagnosis not present

## 2022-04-25 DIAGNOSIS — Z131 Encounter for screening for diabetes mellitus: Secondary | ICD-10-CM | POA: Diagnosis not present

## 2022-04-25 DIAGNOSIS — Z13220 Encounter for screening for lipoid disorders: Secondary | ICD-10-CM | POA: Diagnosis not present

## 2022-04-25 DIAGNOSIS — Z113 Encounter for screening for infections with a predominantly sexual mode of transmission: Secondary | ICD-10-CM | POA: Diagnosis not present

## 2022-04-25 DIAGNOSIS — Z01419 Encounter for gynecological examination (general) (routine) without abnormal findings: Secondary | ICD-10-CM | POA: Diagnosis not present

## 2022-04-25 DIAGNOSIS — Z1329 Encounter for screening for other suspected endocrine disorder: Secondary | ICD-10-CM | POA: Diagnosis not present

## 2022-04-25 LAB — IRON AND IRON BINDING CAPACITY (CC-WL,HP ONLY)
Iron: 47 ug/dL (ref 28–170)
Saturation Ratios: 13 % (ref 10.4–31.8)
TIBC: 367 ug/dL (ref 250–450)
UIBC: 320 ug/dL (ref 148–442)

## 2022-04-25 LAB — CBC WITH DIFFERENTIAL (CANCER CENTER ONLY)
Abs Immature Granulocytes: 0.01 10*3/uL (ref 0.00–0.07)
Basophils Absolute: 0.1 10*3/uL (ref 0.0–0.1)
Basophils Relative: 1 %
Eosinophils Absolute: 0.5 10*3/uL (ref 0.0–0.5)
Eosinophils Relative: 8 %
HCT: 35.5 % — ABNORMAL LOW (ref 36.0–46.0)
Hemoglobin: 12.4 g/dL (ref 12.0–15.0)
Immature Granulocytes: 0 %
Lymphocytes Relative: 29 %
Lymphs Abs: 1.7 10*3/uL (ref 0.7–4.0)
MCH: 30.1 pg (ref 26.0–34.0)
MCHC: 34.9 g/dL (ref 30.0–36.0)
MCV: 86.2 fL (ref 80.0–100.0)
Monocytes Absolute: 0.4 10*3/uL (ref 0.1–1.0)
Monocytes Relative: 6 %
Neutro Abs: 3.3 10*3/uL (ref 1.7–7.7)
Neutrophils Relative %: 56 %
Platelet Count: 326 10*3/uL (ref 150–400)
RBC: 4.12 MIL/uL (ref 3.87–5.11)
RDW: 13.4 % (ref 11.5–15.5)
WBC Count: 5.9 10*3/uL (ref 4.0–10.5)
nRBC: 0 % (ref 0.0–0.2)

## 2022-04-25 LAB — FERRITIN: Ferritin: 59 ng/mL (ref 11–307)

## 2022-04-25 NOTE — Progress Notes (Signed)
[name]   Telephone:(336) [phone] Fax:(336) [phone]   Clinic Follow up Note   Patient Care Team: Antony Contras, MD as PCP - General (Family Medicine) Truitt Merle, MD as Consulting Physician (Hematology)  Date of Service:  04/25/2022  CHIEF COMPLAINT: f/u of iron deficient anemia  CURRENT THERAPY:  Surveillance, s/p hysterectomy 07/2021  ASSESSMENT & PLAN:  Donna Conley is a 36 y.o. female with   1. 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 She did have a normal CBC with normal MCV in 2009  -Her initial outside labs from her PCP was found to have abnormal CBC from Feb and March 2019 with Hgb of 9.2 and 9.9, low ferritin, low HCT, low MCV, low MCH, and high RDW, which is consistent with iron deficient anemia. She does not have overt GI bleeding that would cause her to lose blood.   Her iron deficient anemia is probably from menorrhagia -She was on Oral ferrous sulfate '325mg'$  once daily but IDA did not improve much. She is taking a prenatal vitamin. -I started her on IV iron, first dose Feraheme, subsequently changed to Venofer due to possible allergy reactions (nausea and SOB).  She tolerated better -she underwent hysterectomy with salpingectomy on 07/26/21. -her labs have improved since her surgery, and her hgb is WNL today. Iron panel and ferritin pending.  -Since her anemia Has Resolved after hysterectomy, I will see her as needed.  I encouraged her to check CBC annually with her PCP.     PLAN:  -will send her labs to her PCP, per her request -f/u as needed    No problem-specific Assessment & Plan notes found for this encounter.   INTERVAL HISTORY:  Donna Conley is here for a follow up of anemia. She was last seen by me on 09/21/21. She presents to the clinic alone. She reports she is doing well overall, feeling overall better since her hysterectomy.   All other  systems were reviewed with the patient and are negative.  MEDICAL HISTORY:  Past Medical History:  Diagnosis Date   Anemia    Urinary tract infection     SURGICAL HISTORY: Past Surgical History:  Procedure Laterality Date   CESAREAN SECTION  11/12/2007   General Anesthesia   CESAREAN SECTION  09/24/2012   Procedure: CESAREAN SECTION;  Surgeon: Shelly Bombard, MD;  Location: [facility];  Service: Obstetrics;  Laterality: N/A;   LAPAROSCOPIC TUBAL LIGATION  11/20/2012   Procedure: LAPAROSCOPIC TUBAL LIGATION;  Surgeon: Lahoma Crocker, MD;  Location: [facility];  Service: Gynecology;  Laterality: Bilateral;   ROBOTIC ASSISTED LAPAROSCOPIC HYSTERECTOMY AND SALPINGECTOMY Bilateral 07/26/2021   Procedure: XI ROBOTIC ASSISTED LAPAROSCOPIC HYSTERECTOMY AND SALPINGECTOMY;  Surgeon: Azucena Fallen, MD;  Location: [facility];  Service: Gynecology;  Laterality: Bilateral;  Requests 3hrs.   WRIST SURGERY Right 12/2020   cyst@ [facility] [place]    I have reviewed the social history and family history with the patient and they are unchanged from previous note.  ALLERGIES:  is allergic to feraheme [ferumoxytol].  MEDICATIONS:  Current Outpatient Medications  Medication Sig Dispense Refill   acetaminophen (TYLENOL) 500 MG tablet Take 2 tablets (1,000 mg total) by mouth every 6 (six) hours. 30 tablet 0   fluticasone (FLONASE) 50 MCG/ACT nasal spray Place 2 sprays into both nostrils daily. (Patient taking differently: Place 2 sprays into both nostrils as needed.) 16 g 6  ibuprofen (ADVIL) 800 MG tablet Take 1 tablet (800 mg total) by mouth every 6 (six) hours as needed for moderate pain. 30 tablet 0   valACYclovir (VALTREX) 500 MG tablet Takeo one tablet by mouth twice daily. 30 tablet 1   Vitamin D, Ergocalciferol, (DRISDOL) 50000 units CAPS capsule Take 1 capsule by mouth once a week. sunday     No current facility-administered medications for this visit.    PHYSICAL  EXAMINATION: ECOG PERFORMANCE STATUS: 0 - Asymptomatic  Vitals:   04/25/22 0853  BP: (!) 137/98  Pulse: 99  Resp: 17  Temp: 98.2 F (36.8 C)  SpO2: 100%   Wt Readings from Last 3 Encounters:  04/25/22 215 lb 1.6 oz (97.6 kg)  09/21/21 231 lb 9.6 oz (105.1 kg)  07/26/21 225 lb 14.4 oz (102.5 kg)     GENERAL:alert, no distress and comfortable SKIN: skin color normal, no rashes or significant lesions EYES: normal, Conjunctiva are pink and non-injected, sclera clear  NEURO: alert & oriented x 3 with fluent speech  LABORATORY DATA:  I have reviewed the data as listed    Latest Ref Rng & Units 04/25/2022    8:44 AM 12/24/2021    7:57 AM 09/21/2021    3:29 PM  CBC  WBC 4.0 - 10.5 K/uL 5.9  5.1  6.5   Hemoglobin 12.0 - 15.0 g/dL 12.4  11.6  11.1   Hematocrit 36.0 - 46.0 % 35.5  34.5  33.8   Platelets 150 - 400 K/uL 326  316  313         Latest Ref Rng & Units 07/24/2021    9:16 AM 12/18/2013    7:45 AM 04/21/2012    5:23 PM  CMP  Glucose 70 - 99 mg/dL 92  99  85   BUN 6 - 20 mg/dL '10  8  7   '$ Creatinine 0.44 - 1.00 mg/dL 0.71  0.80  0.66   Sodium 135 - 145 mmol/L 138  139  133   Potassium 3.5 - 5.1 mmol/L 4.0  3.6  3.8   Chloride 98 - 111 mmol/L 108  104  102   CO2 22 - 32 mmol/L '23  23  23   '$ Calcium 8.9 - 10.3 mg/dL 9.1  9.4  9.1   Total Protein 6.0 - 8.3 g/dL  7.8  6.6   Total Bilirubin 0.3 - 1.2 mg/dL  0.5  0.2   Alkaline Phos 39 - 117 U/L  49  37   AST 0 - 37 U/L  18  16   ALT 0 - 35 U/L  14  20       RADIOGRAPHIC STUDIES: I have personally reviewed the radiological images as listed and agreed with the findings in the report. No results found.    No orders of the defined types were placed in this encounter.  All questions were answered. The patient knows to call the clinic with any problems, questions or concerns. No barriers to learning was detected. The total time spent in the appointment was 15 minutes.     Truitt Merle, MD 04/25/2022   I, Wilburn Mylar, am acting as scribe for Truitt Merle, MD.   I have reviewed the above documentation for accuracy and completeness, and I agree with the above.

## 2022-04-25 NOTE — Telephone Encounter (Signed)
Pt's ferritin results finally resulted and Epic faxed to pt's PCP.  Fax confirmation received.

## 2022-04-25 NOTE — Progress Notes (Signed)
Faxed pt's recent lab results (all but Ferritin because it has not resulted) and Dr. Ernestina Penna last office note to pt's PCP today per Dr. Ernestina Penna written request.

## 2022-08-30 DIAGNOSIS — E559 Vitamin D deficiency, unspecified: Secondary | ICD-10-CM | POA: Diagnosis not present

## 2022-09-09 IMAGING — MR MR WRIST*R* W/O CM
6 series · 40 of 40 positions shown · non-contrast
Comparison: None.

CLINICAL DATA: Chronic right wrist pain and swelling. Lump on the
right wrist. No known injury.

EXAM:
MR OF THE RIGHT WRIST WITHOUT CONTRAST
TECHNIQUE: Multiplanar, multisequence MR imaging of the right wrist was
performed. No intravenous contrast was administered.

[Series 7: T1 · coronal · right · 3.0mm · 0.43mm/px · 7 of 19 slices shown (1 of 2)]
[im 1/19]
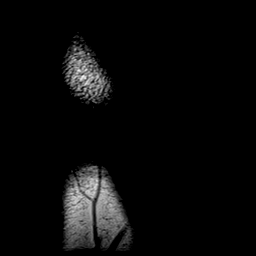
[im 4/19]
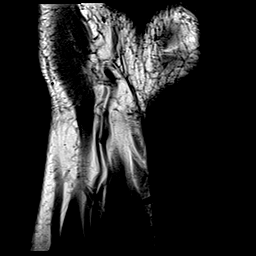
[im 7/19]
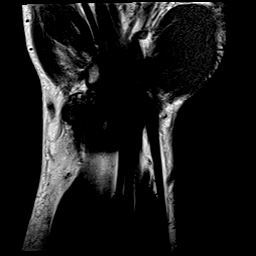
[im 10/19]
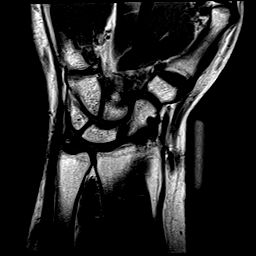
[im 13/19]
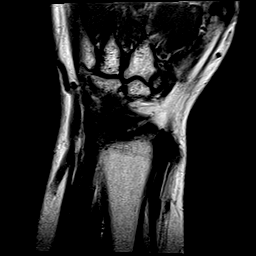
[im 16/19]
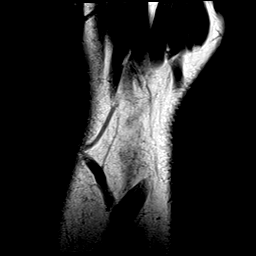
[im 19/19]
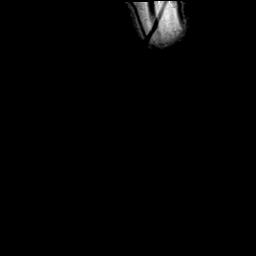

[Series 8: T2 fat-sat · coronal · right · 3.0mm · 0.43mm/px · 6 of 19 slices shown (1 of 3)]
[im 1/19]
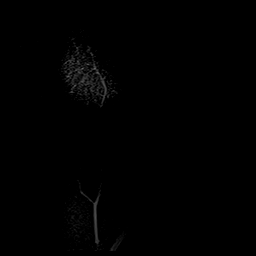
[im 4/19]
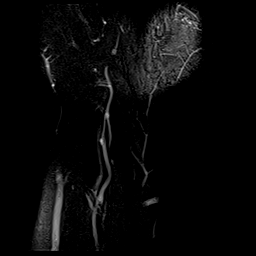
[im 8/19]
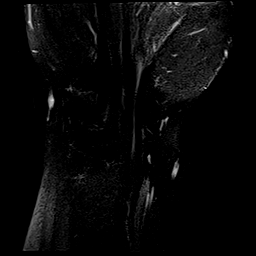
[im 11/19]
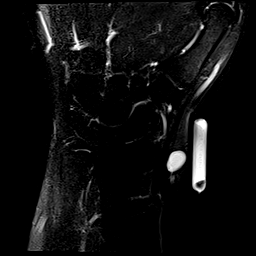
[im 15/19]
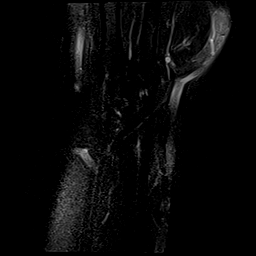
[im 19/19]
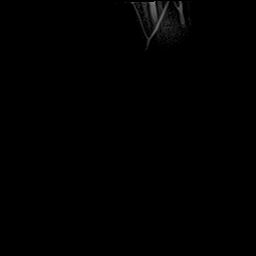

[Series 11: PD fat-sat · sagittal · right · 3.0mm · 0.34mm/px · 9 of 25 slices shown]
[im 1/25]
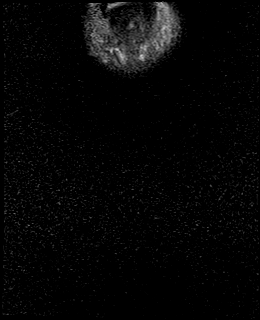
[im 4/25]
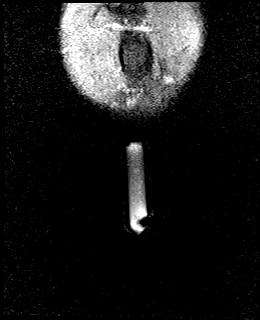
[im 7/25]
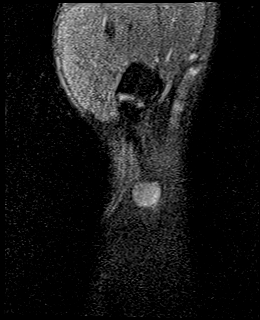
[im 10/25]
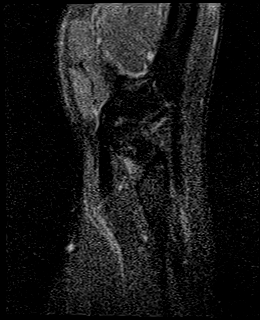
[im 13/25]
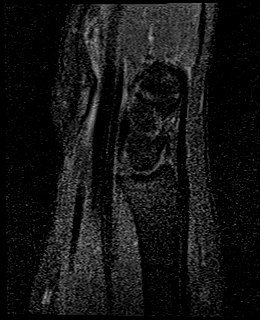
[im 16/25]
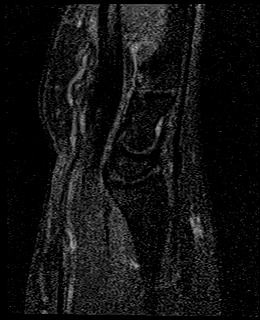
[im 19/25]
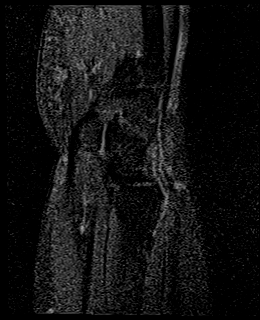
[im 22/25]
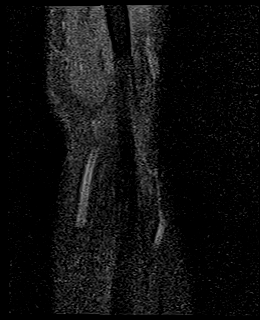
[im 25/25]
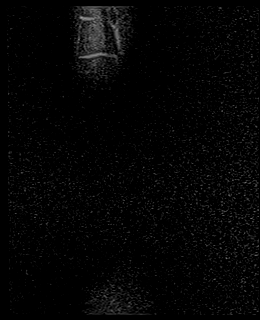

[Series 100: T2 fat-sat · axial · right · 4.0mm · 0.43mm/px · z∈[-86,-12]mm · 6 of 18 slices shown (2 of 3)]
[im 1/18]
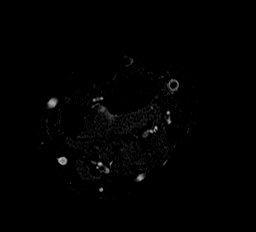
[im 4/18]
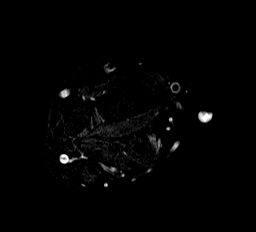
[im 7/18]
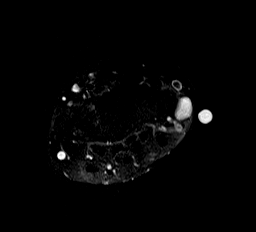
[im 11/18]
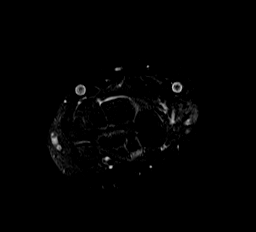
[im 14/18]
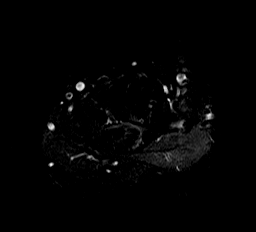
[im 18/18]
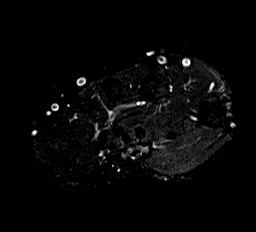

[Series 102: T1 · axial · right · 4.0mm · 0.21mm/px · z∈[-86,-12]mm · 6 of 18 slices shown (2 of 2)]
[im 1/18]
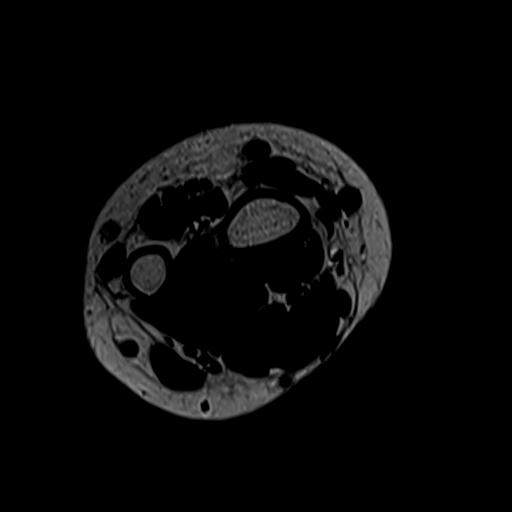
[im 4/18]
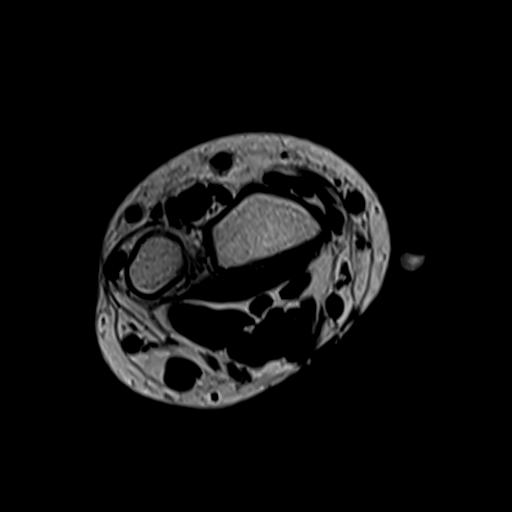
[im 7/18]
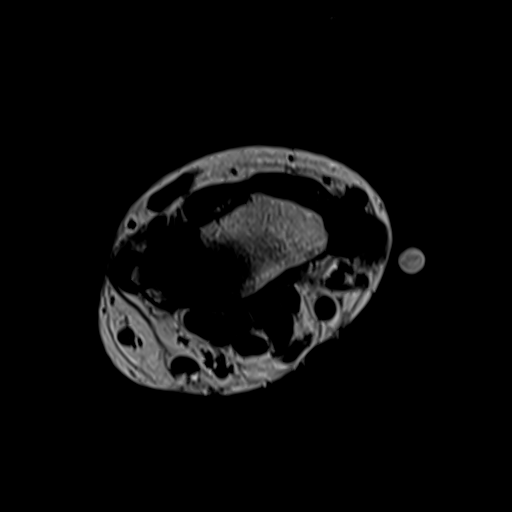
[im 11/18]
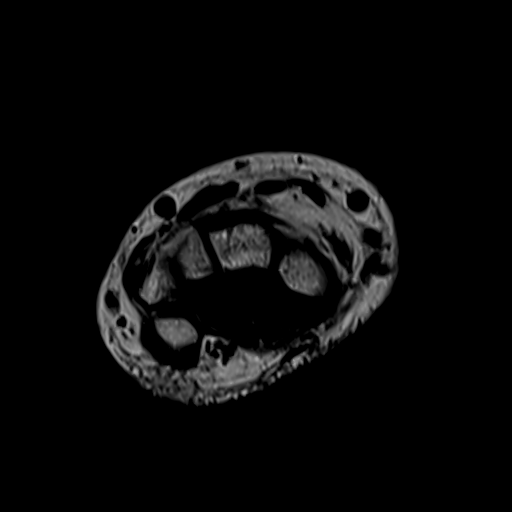
[im 14/18]
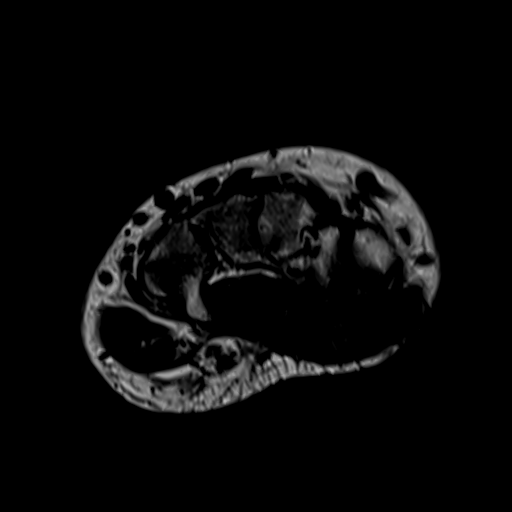
[im 18/18]
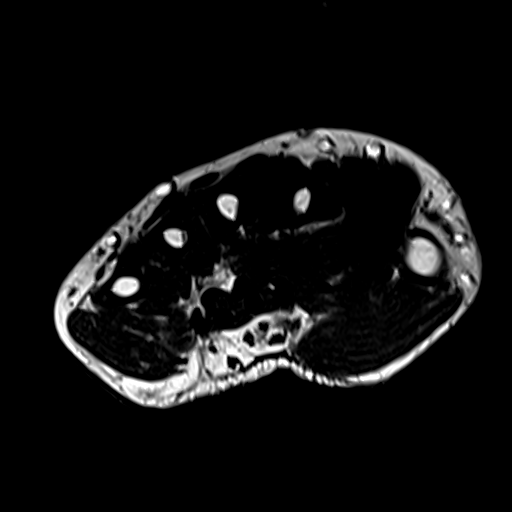

[Series 103: T2 fat-sat · axial · right · 4.0mm · 0.43mm/px · z∈[-86,-12]mm · 6 of 18 slices shown (3 of 3)]
[im 1/18]
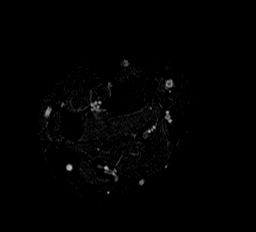
[im 4/18]
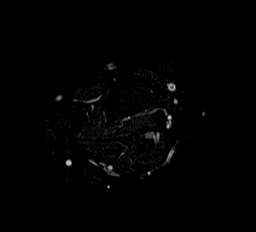
[im 7/18]
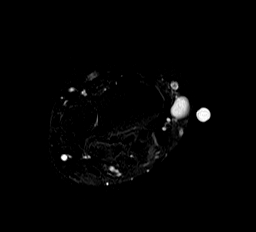
[im 11/18]
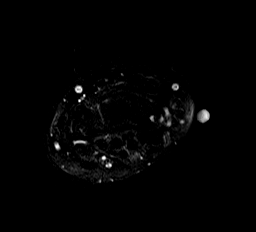
[im 14/18]
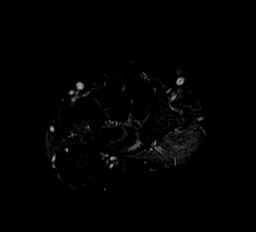
[im 18/18]
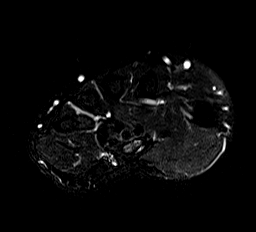

[40 of 40 positions shown; findings below may reference images not displayed]

FINDINGS: Ligaments: Intact.

Triangular fibrocartilage: Intact.

Tendons: There is mild intrasubstance increased T2 signal in the
compartment 1 extensor tendons, greater in the extensor pollicis
brevis (EPB) consistent with de Quervain tenosynovitis. There may be
some interstitial tearing of the EPB but no full-thickness tear is
identified.

Carpal tunnel/median nerve: Negative.

Guyon's canal: Negative.

Joint/cartilage: Negative.

Bones/carpal alignment: Normal.

Other: A cyst subjacent to a marker placed in the region of concern
measures 0.9 cm craniocaudal by 1 cm AP x 0.6 cm transverse. The
cyst immediately overlies the compartment 1 extensor tendons.
IMPRESSION: Findings consistent with de Quervain tenosynovitis as described
above. Cyst overlying the tendons is consistent with a ganglion or
synovial cyst. The exam is otherwise negative.

## 2022-10-24 ENCOUNTER — Encounter: Payer: Self-pay | Admitting: Hematology

## 2022-10-24 ENCOUNTER — Telehealth: Payer: 59 | Admitting: Family

## 2022-10-24 DIAGNOSIS — J3489 Other specified disorders of nose and nasal sinuses: Secondary | ICD-10-CM | POA: Diagnosis not present

## 2022-10-24 MED ORDER — MUPIROCIN CALCIUM 2 % EX CREA: 1.0000 | TOPICAL_CREAM | Freq: Two times a day (BID) | CUTANEOUS | 0 refills | Status: AC

## 2022-10-24 NOTE — Progress Notes (Signed)
E Visit for Rash  We are sorry that you are not feeling well. Here is how we plan to help!  It appears you have a sore in your nostril. I have sent in  Bactroban 2% that you will apply twice a day.   HOME CARE:  Take cool showers and avoid direct sunlight. Apply cool compress or wet dressings. Take a bath in an oatmeal bath.  Sprinkle content of one Aveeno packet under running faucet with comfortably warm water.  Bathe for 15-20 minutes, 1-2 times daily.  Pat dry with a towel. Do not rub the rash. Use hydrocortisone cream. Take an antihistamine like Benadryl for widespread rashes that itch.  The adult dose of Benadryl is 25-50 mg by mouth 4 times daily. Caution:  This type of medication may cause sleepiness.  Do not drink alcohol, drive, or operate dangerous machinery while taking antihistamines.  Do not take these medications if you have prostate enlargement.  Read package instructions thoroughly on all medications that you take.  GET HELP RIGHT AWAY IF:  Symptoms don't go away after treatment. Severe itching that persists. If you rash spreads or swells. If you rash begins to smell. If it blisters and opens or develops a yellow-brown crust. You develop a fever. You have a sore throat. You become short of breath.  MAKE SURE YOU:  Understand these instructions. Will watch your condition. Will get help right away if you are not doing well or get worse.  Thank you for choosing an e-visit.  Your e-visit answers were reviewed by a board certified advanced clinical practitioner to complete your personal care plan. Depending upon the condition, your plan could have included both over the counter or prescription medications.  Please review your pharmacy choice. Make sure the pharmacy is open so you can pick up prescription now. If there is a problem, you may contact your provider through [employer] and have the prescription routed to another pharmacy.  Your safety is important to  [place]. If you have drug allergies check your prescription carefully.   For the next 24 hours you can use MyChart to ask questions about today's visit, request a non-urgent call back, or ask for a work or school excuse. You will get an email in the next two days asking about your experience. I hope that your e-visit has been valuable and will speed your recovery.   Approximately 5 minutes was spent documenting and reviewing patient's chart.

## 2022-10-26 ENCOUNTER — Encounter: Payer: Self-pay | Admitting: Hematology

## 2023-01-14 DIAGNOSIS — K130 Diseases of lips: Secondary | ICD-10-CM | POA: Diagnosis not present

## 2023-04-28 DIAGNOSIS — Z01411 Encounter for gynecological examination (general) (routine) with abnormal findings: Secondary | ICD-10-CM | POA: Diagnosis not present

## 2023-04-28 DIAGNOSIS — Z0000 Encounter for general adult medical examination without abnormal findings: Secondary | ICD-10-CM | POA: Diagnosis not present

## 2023-04-28 DIAGNOSIS — Z124 Encounter for screening for malignant neoplasm of cervix: Secondary | ICD-10-CM | POA: Diagnosis not present

## 2023-04-28 DIAGNOSIS — Z1329 Encounter for screening for other suspected endocrine disorder: Secondary | ICD-10-CM | POA: Diagnosis not present

## 2023-04-28 DIAGNOSIS — Z6837 Body mass index (BMI) 37.0-37.9, adult: Secondary | ICD-10-CM | POA: Diagnosis not present

## 2023-04-28 DIAGNOSIS — Z90711 Acquired absence of uterus with remaining cervical stump: Secondary | ICD-10-CM | POA: Diagnosis not present

## 2023-04-28 DIAGNOSIS — Z13220 Encounter for screening for lipoid disorders: Secondary | ICD-10-CM | POA: Diagnosis not present

## 2023-04-28 DIAGNOSIS — Z113 Encounter for screening for infections with a predominantly sexual mode of transmission: Secondary | ICD-10-CM | POA: Diagnosis not present

## 2023-04-28 DIAGNOSIS — A609 Anogenital herpesviral infection, unspecified: Secondary | ICD-10-CM | POA: Diagnosis not present

## 2023-04-28 DIAGNOSIS — Z130 Encounter for screening for diseases of the blood and blood-forming organs and certain disorders involving the immune mechanism: Secondary | ICD-10-CM | POA: Diagnosis not present

## 2023-04-28 DIAGNOSIS — E559 Vitamin D deficiency, unspecified: Secondary | ICD-10-CM | POA: Diagnosis not present

## 2023-04-28 DIAGNOSIS — Z01419 Encounter for gynecological examination (general) (routine) without abnormal findings: Secondary | ICD-10-CM | POA: Diagnosis not present

## 2023-04-28 DIAGNOSIS — Z131 Encounter for screening for diabetes mellitus: Secondary | ICD-10-CM | POA: Diagnosis not present

## 2023-05-17 ENCOUNTER — Encounter: Payer: Self-pay | Admitting: Hematology

## 2023-05-19 ENCOUNTER — Ambulatory Visit
Admission: EM | Admit: 2023-05-19 | Discharge: 2023-05-19 | Disposition: A | Discharge status: Home / Self Care | Payer: 59 | Attending: Internal Medicine | Admitting: Internal Medicine

## 2023-05-19 ENCOUNTER — Telehealth: Payer: 59 | Admitting: Physician Assistant

## 2023-05-19 ENCOUNTER — Ambulatory Visit: Payer: Self-pay

## 2023-05-19 DIAGNOSIS — H9319 Tinnitus, unspecified ear: Secondary | ICD-10-CM | POA: Diagnosis not present

## 2023-05-19 DIAGNOSIS — H6993 Unspecified Eustachian tube disorder, bilateral: Secondary | ICD-10-CM | POA: Diagnosis not present

## 2023-05-19 DIAGNOSIS — R09A2 Foreign body sensation, throat: Secondary | ICD-10-CM | POA: Diagnosis not present

## 2023-05-19 MED ORDER — FLUTICASONE PROPIONATE 50 MCG/ACT NA SUSP: 1.0000 | Freq: Every day | NASAL | 0 refills | Status: AC

## 2023-05-19 MED ORDER — PREDNISONE 20 MG PO TABS: 40.0000 mg | ORAL_TABLET | Freq: Every day | ORAL | 0 refills | Status: AC

## 2023-05-19 NOTE — Addendum Note (Signed)
Addended by: Margaretann Loveless on: 05/19/2023 11:09 AM   Modules accepted: Level of Service

## 2023-05-19 NOTE — Progress Notes (Signed)
Because of having a continued feeling of something stuck in your throat, I feel your condition warrants further evaluation and I recommend that you be seen for a face to face visit. This could be a tonsillar stone or swollen tonsil or even reflux can cause this. All these symptoms would require an in person evaluation of your throat.  Please contact your primary care physician practice to be seen. Many offices offer virtual options to be seen via video if you are not comfortable going in person to a medical facility at this time.  NOTE: You will NOT be charged for this eVisit.  If you do not have a PCP, [facility] offers a free physician referral service available at [phone]. Our trained staff has the experience, knowledge and resources to put you in touch with a physician who is right for you.    If you are having a true medical emergency please call 911.   Your e-visit answers were reviewed by a board certified advanced clinical practitioner to complete your personal care plan.  Thank you for using e-Visits.  I have spent 5 minutes in review of e-visit questionnaire, review and updating patient chart, medical decision making and response to patient.   Margaretann Loveless, PA-C

## 2023-05-19 NOTE — ED Provider Notes (Signed)
[facility]    CSN: [id number] Arrival date & time: 05/19/23  1724      History   Chief Complaint No chief complaint on file.   HPI Donna Conley is a 37 y.o. female presents for evaluation of ear pain/ringing and globus sensation.  Patient reports 10 days of sinus pressure/congestion with tinnitus and globus sensation.  States she did have a sore throat initially but this is now resolved and no longer has a sore throat.  No cough or fevers or chills.  She is able to eat and drink normally.  No shortness of breath.  Denies any history of thyromegaly.  Does have a history of sinus infections in the past.  She states her nasal congestion has improved.  She has been using over-the-counter cold medicine intermittently.  No asthma or smoking history.  No other concerns at this time.  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/26/2021   Menorrhagia 07/26/2021   Iron deficiency anemia 02/27/2018   Encounter for female sterilization procedure 11/20/2012    Past Surgical History:  Procedure Laterality Date   CESAREAN SECTION  11/12/2007   General Anesthesia   CESAREAN SECTION  09/24/2012   Procedure: CESAREAN SECTION;  Surgeon: Brock Bad, MD;  Location: [facility];  Service: Obstetrics;  Laterality: N/A;   LAPAROSCOPIC TUBAL LIGATION  11/20/2012   Procedure: LAPAROSCOPIC TUBAL LIGATION;  Surgeon: Antionette Char, MD;  Location: [facility];  Service: Gynecology;  Laterality: Bilateral;   ROBOTIC ASSISTED LAPAROSCOPIC HYSTERECTOMY AND SALPINGECTOMY Bilateral 07/26/2021   Procedure: XI ROBOTIC ASSISTED LAPAROSCOPIC HYSTERECTOMY AND SALPINGECTOMY;  Surgeon: Shea Evans, MD;  Location: [facility];  Service: Gynecology;  Laterality: Bilateral;  Requests 3hrs.   WRIST SURGERY Right 12/2020   cyst@ [facility]    OB History     Gravida  2   Para  2   Term  1   Preterm      AB      Living  1      SAB      IAB      Ectopic      Multiple      Live Births  1            Home Medications    Prior to Admission medications   Medication Sig Start Date End Date Taking? Authorizing Provider  fluticasone (FLONASE) 50 MCG/ACT nasal spray Place 1 spray into both nostrils daily. 05/19/23  Yes Radford Pax, NP  predniSONE (DELTASONE) 20 MG tablet Take 2 tablets (40 mg total) by mouth daily with breakfast for 5 days. 05/19/23 05/24/23 Yes Radford Pax, NP  acetaminophen (TYLENOL) 500 MG tablet Take 2 tablets (1,000 mg total) by mouth every 6 (six) hours. 07/26/21   Shea Evans, MD  ibuprofen (ADVIL) 800 MG tablet Take 1 tablet (800 mg total) by mouth every 6 (six) hours as needed for moderate pain. 07/26/21   Shea Evans, MD  mupirocin cream (BACTROBAN) 2 % Apply 1 Application topically 2 (two) times daily. 10/24/22   Junie Spencer, FNP  valACYclovir (VALTREX) 500 MG tablet Perrin Smack one tablet by mouth twice daily. 08/10/21     Vitamin D, Ergocalciferol, (DRISDOL) 50000 units CAPS capsule Take 1 capsule by mouth once a week. sunday    [provider]  mometasone (NASONEX) 50 MCG/ACT nasal spray Place 2 sprays into the nose daily. 12/13/11 01/21/12  Loren Racer, MD    Family History Family History  Problem Relation Age of Onset   Anemia Mother    Anemia Maternal Grandmother     Social History Social History   Tobacco Use   Smoking status: Never   Smokeless tobacco: Never  Vaping Use   Vaping Use: Never used  Substance Use Topics   Alcohol use: No   Drug use: No     Allergies   Feraheme [ferumoxytol]   Review of Systems Review of Systems  HENT:  Positive for tinnitus.        Globus sensation     Physical Exam Triage Vital Signs ED Triage Vitals  Enc Vitals Group     BP 05/19/23 1749 131/84     Pulse Rate 05/19/23 1749 71     Resp  05/19/23 1749 16     Temp 05/19/23 1749 98.3 F (36.8 C)     Temp Source 05/19/23 1749 Oral     SpO2 05/19/23 1749 97 %     Weight --      Height --      Head Circumference --      Peak Flow --      Pain Score 05/19/23 1753 0     Pain Loc --      Pain Edu? --      Excl. in GC? --    No data found.  Updated Vital Signs BP 131/84 (BP Location: Right Arm)   Pulse 71   Temp 98.3 F (36.8 C) (Oral)   Resp 16   LMP 06/14/2021 (Exact Date)   SpO2 97%   Visual Acuity Right Eye Distance:   Left Eye Distance:   Bilateral Distance:    Right Eye Near:   Left Eye Near:    Bilateral Near:     Physical Exam Vitals and nursing note reviewed.  Constitutional:      General: She is not in acute distress.    Appearance: She is well-developed. She is not ill-appearing.  HENT:     Head: Normocephalic and atraumatic.     Right Ear: Tympanic membrane and ear canal normal.     Left Ear: Tympanic membrane and ear canal normal.     Nose: Congestion present.     Mouth/Throat:     Mouth: Mucous membranes are moist.     Pharynx: Oropharynx is clear. Uvula midline. No pharyngeal swelling, oropharyngeal exudate, posterior oropharyngeal erythema or uvula swelling.     Tonsils: No tonsillar exudate or tonsillar abscesses.  Eyes:     Conjunctiva/sclera: Conjunctivae normal.     Pupils: Pupils are equal, round, and reactive to light.  Neck:     Thyroid: No thyromegaly.  Cardiovascular:     Rate and Rhythm: Normal rate and regular rhythm.     Heart sounds: Normal heart sounds.  Pulmonary:     Effort: Pulmonary effort is normal.     Breath sounds: Normal breath sounds.  Musculoskeletal:     Cervical back: Normal range of motion and neck supple.  Lymphadenopathy:     Cervical: No cervical adenopathy.  Skin:    General: Skin is warm and dry.  Neurological:     General: No focal deficit present.     Mental Status: She is alert and oriented to person, place, and time.  Psychiatric:  Mood and Affect: Mood normal.        Behavior: Behavior normal.      UC Treatments / Results  Labs (all labs ordered are listed, but only abnormal results are displayed) Labs Reviewed - No data to display  EKG   Radiology No results found.  Procedures Procedures (including critical care time)  Medications Ordered in UC Medications - No data to display  Initial Impression / Assessment and Plan / UC Course  I have reviewed the triage vital signs and the nursing notes.  Pertinent labs & imaging results that were available during my care of the patient were reviewed by me and considered in my medical decision making (see chart for details).     Reviewed exam and symptoms with patient.  No red flags.  Discussed eustachian tube dysfunction/tinnitus.  Also reviewed globus sensation.  No signs of thyromegaly.  Airway is patent.  Denies sore throat.  She is eating and drinking without difficulty.  No significant lymphadenopathy.  Will do trial of Flonase and prednisone.  Patient will follow-up with PCP if symptoms do not improve.  ER precautions reviewed and patient verbalized understanding Final Clinical Impressions(s) / UC Diagnoses   Final diagnoses:  Globus sensation  Eustachian tube dysfunction, bilateral  Tinnitus, unspecified laterality     Discharge Instructions      Start Flonase daily.  Take prednisone once a day for the next 5 days.  Please follow-up with your PCP if your symptoms do not improve.  Please go to the emergency room for any worsening symptoms.  I hope you feel better soon!    ED Prescriptions     Medication Sig Dispense Auth. Provider   fluticasone (FLONASE) 50 MCG/ACT nasal spray Place 1 spray into both nostrils daily. 15.8 mL Radford Pax, NP   predniSONE (DELTASONE) 20 MG tablet Take 2 tablets (40 mg total) by mouth daily with breakfast for 5 days. 10 tablet Radford Pax, NP      PDMP not reviewed this encounter.   Radford Pax,  NP 05/19/23 [phone]

## 2023-05-19 NOTE — Discharge Instructions (Addendum)
Start Flonase daily.  Take prednisone once a day for the next 5 days.  Please follow-up with your PCP if your symptoms do not improve.  Please go to the emergency room for any worsening symptoms.  I hope you feel better soon!

## 2023-05-19 NOTE — ED Triage Notes (Signed)
Pt presents to UC w/ c/o feeling of a "lump in her throat" for about 10 days and right ear pain that comes and goes. Pt states she has had some nasal drainage. Denies fever.

## 2023-05-28 ENCOUNTER — Telehealth: Payer: 59 | Admitting: Nurse Practitioner

## 2023-05-28 DIAGNOSIS — R3989 Other symptoms and signs involving the genitourinary system: Secondary | ICD-10-CM | POA: Diagnosis not present

## 2023-05-28 MED ORDER — NITROFURANTOIN MONOHYD MACRO 100 MG PO CAPS: 100.0000 mg | ORAL_CAPSULE | Freq: Two times a day (BID) | ORAL | 0 refills | Status: AC

## 2023-05-28 NOTE — Progress Notes (Signed)
E-Visit for Urinary Problems  We are sorry that you are not feeling well.  Here is how we plan to help!  Based on what you shared with me it looks like you most likely have a simple urinary tract infection.  A UTI (Urinary Tract Infection) is a bacterial infection of the bladder.  Most cases of urinary tract infections are simple to treat but a key part of your care is to encourage you to drink plenty of fluids and watch your symptoms carefully.  I have prescribed MacroBid 100 mg twice a day for 5 days.  Your symptoms should gradually improve. Call us if the burning in your urine worsens, you develop worsening fever, back pain or pelvic pain or if your symptoms do not resolve after completing the antibiotic.  Urinary tract infections can be prevented by drinking plenty of water to keep your body hydrated.  Also be sure when you wipe, wipe from front to back and don't hold it in!  If possible, empty your bladder every 4 hours.  HOME CARE Drink plenty of fluids Compete the full course of the antibiotics even if the symptoms resolve Remember, when you need to go.go. Holding in your urine can increase the likelihood of getting a UTI! GET HELP RIGHT AWAY IF: You cannot urinate You get a high fever Worsening back pain occurs You see blood in your urine You feel sick to your stomach or throw up You feel like you are going to pass out  MAKE SURE YOU  Understand these instructions. Will watch your condition. Will get help right away if you are not doing well or get worse.   Thank you for choosing an e-visit.  Your e-visit answers were reviewed by a board certified advanced clinical practitioner to complete your personal care plan. Depending upon the condition, your plan could have included both over the counter or prescription medications.  Please review your pharmacy choice. Make sure the pharmacy is open so you can pick up prescription now. If there is a problem, you may contact your  provider through MyChart messaging and have the prescription routed to another pharmacy.  Your safety is important to us. If you have drug allergies check your prescription carefully.   For the next 24 hours you can use MyChart to ask questions about today's visit, request a non-urgent call back, or ask for a work or school excuse. You will get an email in the next two days asking about your experience. I hope that your e-visit has been valuable and will speed your recovery.   Meds ordered this encounter  Medications   nitrofurantoin, macrocrystal-monohydrate, (MACROBID) 100 MG capsule    Sig: Take 1 capsule (100 mg total) by mouth 2 (two) times daily for 5 days.    Dispense:  10 capsule    Refill:  0     I spent approximately 5 minutes reviewing the patient's history, current symptoms and coordinating their care today.   

## 2023-06-30 DIAGNOSIS — E7800 Pure hypercholesterolemia, unspecified: Secondary | ICD-10-CM | POA: Diagnosis not present

## 2023-06-30 DIAGNOSIS — Z6838 Body mass index (BMI) 38.0-38.9, adult: Secondary | ICD-10-CM | POA: Diagnosis not present

## 2023-06-30 DIAGNOSIS — R3915 Urgency of urination: Secondary | ICD-10-CM | POA: Diagnosis not present

## 2023-06-30 DIAGNOSIS — Z8669 Personal history of other diseases of the nervous system and sense organs: Secondary | ICD-10-CM | POA: Diagnosis not present

## 2023-08-22 ENCOUNTER — Encounter: Payer: Self-pay | Admitting: Hematology

## 2023-08-22 ENCOUNTER — Other Ambulatory Visit (HOSPITAL_COMMUNITY): Payer: Self-pay

## 2023-08-22 ENCOUNTER — Other Ambulatory Visit (HOSPITAL_BASED_OUTPATIENT_CLINIC_OR_DEPARTMENT_OTHER): Payer: Self-pay

## 2023-08-22 MED ORDER — INFLUENZA VIRUS VACC SPLIT PF (FLUZONE) 0.5 ML IM SUSY
0.5000 mL | PREFILLED_SYRINGE | Freq: Once | INTRAMUSCULAR | 0 refills | Status: DC
  Filled 2023-08-22: qty 0.5, 1d supply, fill #0

## 2023-08-22 MED ORDER — INFLUENZA VIRUS VACC SPLIT PF (FLUZONE) 0.5 ML IM SUSY
0.5000 mL | PREFILLED_SYRINGE | Freq: Once | INTRAMUSCULAR | 0 refills | Status: AC
  Filled 2023-08-22: qty 0.5, 1d supply, fill #0

## 2023-08-25 ENCOUNTER — Other Ambulatory Visit (HOSPITAL_BASED_OUTPATIENT_CLINIC_OR_DEPARTMENT_OTHER): Payer: Self-pay

## 2023-09-01 ENCOUNTER — Other Ambulatory Visit: Payer: Self-pay

## 2023-09-03 DIAGNOSIS — R350 Frequency of micturition: Secondary | ICD-10-CM | POA: Diagnosis not present

## 2023-09-03 DIAGNOSIS — R3915 Urgency of urination: Secondary | ICD-10-CM | POA: Diagnosis not present

## 2023-09-05 ENCOUNTER — Other Ambulatory Visit (HOSPITAL_BASED_OUTPATIENT_CLINIC_OR_DEPARTMENT_OTHER): Payer: Self-pay

## 2023-09-17 ENCOUNTER — Encounter: Payer: Self-pay | Admitting: Hematology

## 2023-09-17 ENCOUNTER — Encounter: Payer: Self-pay | Admitting: Pharmacist

## 2023-09-17 ENCOUNTER — Other Ambulatory Visit: Payer: Self-pay

## 2023-09-17 ENCOUNTER — Other Ambulatory Visit (HOSPITAL_COMMUNITY): Payer: Self-pay

## 2023-09-17 MED ORDER — VALACYCLOVIR HCL 500 MG PO TABS
500.0000 mg | ORAL_TABLET | Freq: Two times a day (BID) | ORAL | 1 refills | Status: AC
  Filled 2023-09-17 (×2): qty 30, 15d supply, fill #0

## 2024-03-31 ENCOUNTER — Encounter

## 2024-04-02 DIAGNOSIS — M79602 Pain in left arm: Secondary | ICD-10-CM | POA: Diagnosis not present

## 2024-04-02 DIAGNOSIS — R202 Paresthesia of skin: Secondary | ICD-10-CM | POA: Diagnosis not present

## 2024-04-02 DIAGNOSIS — M79601 Pain in right arm: Secondary | ICD-10-CM | POA: Diagnosis not present

## 2024-04-28 DIAGNOSIS — S161XXA Strain of muscle, fascia and tendon at neck level, initial encounter: Secondary | ICD-10-CM | POA: Diagnosis not present

## 2024-05-04 DIAGNOSIS — Z1331 Encounter for screening for depression: Secondary | ICD-10-CM | POA: Diagnosis not present

## 2024-05-04 DIAGNOSIS — Z1329 Encounter for screening for other suspected endocrine disorder: Secondary | ICD-10-CM | POA: Diagnosis not present

## 2024-05-04 DIAGNOSIS — Z13220 Encounter for screening for lipoid disorders: Secondary | ICD-10-CM | POA: Diagnosis not present

## 2024-05-04 DIAGNOSIS — Z0000 Encounter for general adult medical examination without abnormal findings: Secondary | ICD-10-CM | POA: Diagnosis not present

## 2024-05-04 DIAGNOSIS — Z114 Encounter for screening for human immunodeficiency virus [HIV]: Secondary | ICD-10-CM | POA: Diagnosis not present

## 2024-05-04 DIAGNOSIS — Z01411 Encounter for gynecological examination (general) (routine) with abnormal findings: Secondary | ICD-10-CM | POA: Diagnosis not present

## 2024-05-04 DIAGNOSIS — E559 Vitamin D deficiency, unspecified: Secondary | ICD-10-CM | POA: Diagnosis not present

## 2024-05-04 DIAGNOSIS — Z01419 Encounter for gynecological examination (general) (routine) without abnormal findings: Secondary | ICD-10-CM | POA: Diagnosis not present

## 2024-05-04 DIAGNOSIS — R350 Frequency of micturition: Secondary | ICD-10-CM | POA: Diagnosis not present

## 2024-05-04 DIAGNOSIS — Z124 Encounter for screening for malignant neoplasm of cervix: Secondary | ICD-10-CM | POA: Diagnosis not present

## 2024-05-04 DIAGNOSIS — Z1159 Encounter for screening for other viral diseases: Secondary | ICD-10-CM | POA: Diagnosis not present

## 2024-05-04 DIAGNOSIS — Z131 Encounter for screening for diabetes mellitus: Secondary | ICD-10-CM | POA: Diagnosis not present

## 2024-05-04 DIAGNOSIS — Z90711 Acquired absence of uterus with remaining cervical stump: Secondary | ICD-10-CM | POA: Diagnosis not present

## 2024-05-04 DIAGNOSIS — Z113 Encounter for screening for infections with a predominantly sexual mode of transmission: Secondary | ICD-10-CM | POA: Diagnosis not present

## 2024-05-04 DIAGNOSIS — R801 Persistent proteinuria, unspecified: Secondary | ICD-10-CM | POA: Diagnosis not present

## 2024-05-13 DIAGNOSIS — M542 Cervicalgia: Secondary | ICD-10-CM | POA: Diagnosis not present

## 2024-05-25 DIAGNOSIS — M542 Cervicalgia: Secondary | ICD-10-CM | POA: Diagnosis not present

## 2024-05-29 ENCOUNTER — Emergency Department (HOSPITAL_BASED_OUTPATIENT_CLINIC_OR_DEPARTMENT_OTHER)
Admission: EM | Admit: 2024-05-29 | Discharge: 2024-05-29 | Disposition: A | Discharge status: Home / Self Care | Attending: Emergency Medicine | Admitting: Emergency Medicine

## 2024-05-29 ENCOUNTER — Other Ambulatory Visit: Payer: Self-pay

## 2024-05-29 DIAGNOSIS — J385 Laryngeal spasm: Secondary | ICD-10-CM | POA: Diagnosis not present

## 2024-05-29 DIAGNOSIS — R220 Localized swelling, mass and lump, head: Secondary | ICD-10-CM | POA: Diagnosis present

## 2024-05-29 MED ORDER — PANTOPRAZOLE SODIUM 40 MG PO TBEC: 40.0000 mg | DELAYED_RELEASE_TABLET | Freq: Every day | ORAL | 0 refills | Status: AC

## 2024-05-29 NOTE — ED Triage Notes (Signed)
 Pt woke up out of her sleep this morning approx 25 min feeling like her throat was closing. No resp distress. Speaking in full sentences. No ace inhibitors. No rash/hives.

## 2024-05-29 NOTE — ED Notes (Signed)
 Dc instructions given, pt verbalized understanding. Pt out of ED with steady gait, all belongings and not in visible distress.

## 2024-05-29 NOTE — ED Provider Notes (Signed)
 [place] EMERGENCY DEPARTMENT AT [facility]  Provider Note  CSN: [id number] Arrival date & time: 05/29/24 0158  History Chief Complaint  Patient presents with   Oral Swelling    Donna Conley is a 38 y.o. female with no significant PMH reports she was in her usual state of health when she went to bed. She was woken up by a sudden onset of feeling like her throat was closing. Symptoms abated after a short time and she now has a persistent mild burning sensations. She had an episode of globus sensation last year but no definite cause was found then. She has a remote history of GERD but not currently on medications. She denies any recent fever, sore throat, cough or congestion. Denies any tongue or lip swelling. She is not on any ACE/ARB.    Home Medications Prior to Admission medications   Medication Sig Start Date End Date Taking? Authorizing Provider  pantoprazole  (PROTONIX ) 40 MG tablet Take 1 tablet (40 mg total) by mouth daily. 05/29/24  Yes Roselyn Carlin NOVAK, MD  acetaminophen  (TYLENOL ) 500 MG tablet Take 2 tablets (1,000 mg total) by mouth every 6 (six) hours. 07/26/21   Barbette Knock, MD  fluticasone  (FLONASE ) 50 MCG/ACT nasal spray Place 1 spray into both nostrils daily. 05/19/23   Mayer, Jodi R, NP  ibuprofen  (ADVIL ) 800 MG tablet Take 1 tablet (800 mg total) by mouth every 6 (six) hours as needed for moderate pain. 07/26/21   Barbette Knock, MD  mupirocin  cream (BACTROBAN ) 2 % Apply 1 Application topically 2 (two) times daily. 10/24/22   Lavell Bari LABOR, FNP  valACYclovir  (VALTREX ) 500 MG tablet Wendi one tablet by mouth twice daily. 08/10/21     valACYclovir  (VALTREX ) 500 MG tablet Take 1 tablet (500 mg total) by mouth 2 (two) times daily for 5 days as directed. 04/28/23   Barbette Knock, MD  Vitamin D, Ergocalciferol, (DRISDOL) 50000 units CAPS capsule Take 1 capsule by mouth once a week. sunday    [provider]  mometasone  (NASONEX ) 50 MCG/ACT nasal spray  Place 2 sprays into the nose daily. 12/13/11 01/21/12  Carlyle Lenis, MD     Allergies    Feraheme  [ferumoxytol ]   Review of Systems   Review of Systems Please see HPI for pertinent positives and negatives  Physical Exam BP (!) 133/100 (BP Location: Right Arm)   Pulse 87   Temp 98.2 F (36.8 C) (Oral)   Resp 17   Ht 5' 2 (1.575 m)   Wt 102.1 kg   LMP 06/14/2021 (Exact Date)   SpO2 99%   BMI 41.15 kg/m   Physical Exam Vitals and nursing note reviewed.  Constitutional:      Appearance: Normal appearance.  HENT:     Head: Normocephalic and atraumatic.     Nose: Nose normal.     Mouth/Throat:     Mouth: Mucous membranes are moist.     Pharynx: No oropharyngeal exudate or posterior oropharyngeal erythema.  Eyes:     Extraocular Movements: Extraocular movements intact.     Conjunctiva/sclera: Conjunctivae normal.  Cardiovascular:     Rate and Rhythm: Normal rate.  Pulmonary:     Effort: Pulmonary effort is normal.     Breath sounds: Normal breath sounds. No stridor.  Abdominal:     General: Abdomen is flat.     Palpations: Abdomen is soft.     Tenderness: There is no abdominal tenderness.  Musculoskeletal:        General:  No swelling. Normal range of motion.     Cervical back: Neck supple.  Skin:    General: Skin is warm and dry.  Neurological:     General: No focal deficit present.     Mental Status: She is alert.  Psychiatric:        Mood and Affect: Mood normal.     ED Results / Procedures / Treatments   EKG None  Procedures Procedures  Medications Ordered in the ED Medications - No data to display  Initial Impression and Plan  Patient here with an episode of what sounds like laryngospasm, suspect from silent GERD. She is well appearing now with normal exam. Will give a trial of PPI, recommend she sleep with HOB elevated and follow up with PCP and/or GI if symptoms persist. RTED for any other concerns.   ED Course       MDM  Rules/Calculators/A&P Medical Decision Making Problems Addressed: Laryngospasm: acute illness or injury  Risk Prescription drug management.     Final Clinical Impression(s) / ED Diagnoses Final diagnoses:  Laryngospasm    Rx / DC Orders ED Discharge Orders          Ordered    pantoprazole  (PROTONIX ) 40 MG tablet  Daily        05/29/24 0242             Roselyn Carlin NOVAK, MD 05/29/24 [phone]

## 2024-06-10 ENCOUNTER — Encounter: Payer: Self-pay | Admitting: Emergency Medicine

## 2024-06-10 ENCOUNTER — Ambulatory Visit: Admission: EM | Admit: 2024-06-10 | Discharge: 2024-06-10 | Disposition: A | Discharge status: Home / Self Care

## 2024-06-10 ENCOUNTER — Other Ambulatory Visit: Payer: Self-pay

## 2024-06-10 DIAGNOSIS — F419 Anxiety disorder, unspecified: Secondary | ICD-10-CM | POA: Diagnosis not present

## 2024-06-10 DIAGNOSIS — R030 Elevated blood-pressure reading, without diagnosis of hypertension: Secondary | ICD-10-CM | POA: Diagnosis not present

## 2024-06-10 DIAGNOSIS — R002 Palpitations: Secondary | ICD-10-CM

## 2024-06-10 HISTORY — DX: Cervicalgia: M54.2

## 2024-06-10 MED ORDER — HYDROXYZINE HCL 25 MG PO TABS: 25.0000 mg | ORAL_TABLET | Freq: Three times a day (TID) | ORAL | 0 refills | Status: AC | PRN

## 2024-06-10 NOTE — ED Provider Notes (Signed)
 [facility]    CSN: [id number] Arrival date & time: 06/10/24  1751      History   Chief Complaint Chief Complaint  Patient presents with   Palpitations    HPI Donna Conley is a 38 y.o. female.   Patient presenting today with several day history of feeling anxious, heart racing episodes here and there and elevated blood pressure readings between 130s over 90s to 150s over 90s.  Denies fever, chills, chest pain, shortness of breath, dizziness, mental status changes, extremity weakness numbness or tingling beyond baseline.  No past history of similar issues per patient.  No new lifestyle modifications, new medications or supplements, and so far not trying anything over-the-counter for symptoms.   Past Medical History:  Diagnosis Date   Anemia    Nontraumatic neck pain    reports related to bone spurs on my spine.   Urinary tract infection    Patient Active Problem List   Diagnosis Date Noted   Status post laparoscopic hysterectomy 07/26/2021   Menorrhagia 07/26/2021   Iron  deficiency anemia 02/27/2018   Encounter for female sterilization procedure 11/20/2012   Past Surgical History:  Procedure Laterality Date   CESAREAN SECTION  11/12/2007   General Anesthesia   CESAREAN SECTION  09/24/2012   Procedure: CESAREAN SECTION;  Surgeon: Carlin DELENA Centers, MD;  Location: [facility];  Service: Obstetrics;  Laterality: N/A;   LAPAROSCOPIC TUBAL LIGATION  11/20/2012   Procedure: LAPAROSCOPIC TUBAL LIGATION;  Surgeon: Olam Mill, MD;  Location: [facility];  Service: Gynecology;  Laterality: Bilateral;   ROBOTIC ASSISTED LAPAROSCOPIC HYSTERECTOMY AND SALPINGECTOMY Bilateral 07/26/2021   Procedure: XI ROBOTIC ASSISTED LAPAROSCOPIC HYSTERECTOMY AND SALPINGECTOMY;  Surgeon: Barbette Knock, MD;  Location: [facility];  Service: Gynecology;  Laterality: Bilateral;  Requests 3hrs.   WRIST SURGERY Right 12/2020   cyst@ [facility]    OB  History     Gravida  2   Para  2   Term  1   Preterm      AB      Living  1      SAB      IAB      Ectopic      Multiple      Live Births  1            Home Medications    Prior to Admission medications   Medication Sig Start Date End Date Taking? Authorizing Provider  gabapentin (NEURONTIN) 300 MG capsule Take 300 mg by mouth daily. 05/25/24  Yes [provider]  hydrOXYzine  (ATARAX ) 25 MG tablet Take 1 tablet (25 mg total) by mouth every 8 (eight) hours as needed. 06/10/24  Yes Stuart Vernell Norris, PA-C  acetaminophen  (TYLENOL ) 500 MG tablet Take 2 tablets (1,000 mg total) by mouth every 6 (six) hours. 07/26/21   Barbette Knock, MD  fluticasone  (FLONASE ) 50 MCG/ACT nasal spray Place 1 spray into both nostrils daily. 05/19/23   Mayer, Jodi R, NP  ibuprofen  (ADVIL ) 800 MG tablet Take 1 tablet (800 mg total) by mouth every 6 (six) hours as needed for moderate pain. 07/26/21   Barbette Knock, MD  mupirocin  cream (BACTROBAN ) 2 % Apply 1 Application topically 2 (two) times daily. 10/24/22   Lavell Lye A, FNP  pantoprazole  (PROTONIX ) 40 MG tablet Take 1 tablet (40 mg total) by mouth daily. 05/29/24   Roselyn Carlin NOVAK, MD  valACYclovir  (VALTREX ) 500 MG tablet Wendi one tablet by mouth twice daily. 08/10/21  valACYclovir  (VALTREX ) 500 MG tablet Take 1 tablet (500 mg total) by mouth 2 (two) times daily for 5 days as directed. 04/28/23   Barbette Knock, MD  Vitamin D, Ergocalciferol, (DRISDOL) 50000 units CAPS capsule Take 1 capsule by mouth once a week. sunday    [provider]  mometasone  (NASONEX ) 50 MCG/ACT nasal spray Place 2 sprays into the nose daily. 12/13/11 01/21/12  Carlyle Lenis, MD    Family History Family History  Problem Relation Age of Onset   Anemia Mother    Anemia Maternal Grandmother     Social History Social History   Tobacco Use   Smoking status: Never   Smokeless tobacco: Never  Vaping Use   Vaping status: Never Used   Substance Use Topics   Alcohol use: No   Drug use: No     Allergies   Feraheme  [ferumoxytol ]   Review of Systems Review of Systems Per HPI  Physical Exam Triage Vital Signs ED Triage Vitals  Encounter Vitals Group     BP 06/10/24 1803 (!) 165/95     Girls Systolic BP Percentile --      Girls Diastolic BP Percentile --      Boys Systolic BP Percentile --      Boys Diastolic BP Percentile --      Pulse Rate 06/10/24 1803 79     Resp 06/10/24 1803 18     Temp 06/10/24 1803 98.5 F (36.9 C)     Temp Source 06/10/24 1803 Oral     SpO2 06/10/24 1803 96 %     Weight --      Height --      Head Circumference --      Peak Flow --      Pain Score 06/10/24 1804 0     Pain Loc --      Pain Education --      Exclude from Growth Chart --    No data found.  Updated Vital Signs BP (!) 165/95 (BP Location: Right Arm)   Pulse 79   Temp 98.5 F (36.9 C) (Oral)   Resp 18   LMP 06/14/2021 (Exact Date)   SpO2 96%   Visual Acuity Right Eye Distance:   Left Eye Distance:   Bilateral Distance:    Right Eye Near:   Left Eye Near:    Bilateral Near:     Physical Exam Vitals and nursing note reviewed.  Constitutional:      Appearance: Normal appearance. She is not ill-appearing.  HENT:     Head: Atraumatic.     Mouth/Throat:     Mouth: Mucous membranes are moist.  Eyes:     Extraocular Movements: Extraocular movements intact.     Conjunctiva/sclera: Conjunctivae normal.  Cardiovascular:     Rate and Rhythm: Normal rate and regular rhythm.     Heart sounds: Normal heart sounds.  Pulmonary:     Effort: Pulmonary effort is normal.     Breath sounds: Normal breath sounds.  Musculoskeletal:        General: Normal range of motion.     Cervical back: Normal range of motion and neck supple.  Skin:    General: Skin is warm and dry.  Neurological:     Mental Status: She is alert and oriented to person, place, and time.     Cranial Nerves: No cranial nerve deficit.      Motor: No weakness.     Gait: Gait normal.  Psychiatric:  Mood and Affect: Mood normal.        Thought Content: Thought content normal.        Judgment: Judgment normal.      UC Treatments / Results  Labs (all labs ordered are listed, but only abnormal results are displayed) Labs Reviewed  CBC WITH DIFFERENTIAL/PLATELET  COMPREHENSIVE METABOLIC PANEL WITH GFR  TSH    EKG   Radiology No results found.  Procedures Procedures (including critical care time)  Medications Ordered in UC Medications - No data to display  Initial Impression / Assessment and Plan / UC Course  I have reviewed the triage vital signs and the nursing notes.  Pertinent labs & imaging results that were available during my care of the patient were reviewed by me and considered in my medical decision making (see chart for details).     EKG today showing normal sinus rhythm at 82 bpm without acute ST or T wave changes, hypertensive in triage otherwise vital signs reassuring.  She is well-appearing in no acute distress.  Discussed to continue logging home blood pressure readings and follow-up with primary care as soon as possible for recheck.  Will forego starting any blood pressure medication both due to urgent care setting but also as readings have only been elevated for several days and seem to fluctuate from normal range to slightly above so discussed that it can be unsafe to start blood pressure medication at this range for fear of dropping too low.  Regarding anxiety, palpitations did discuss that EKG is only a snapshot of seconds and since her episodes are intermittent she may be a good candidate for an event monitor for longer-term monitoring to capture symptoms.  Unclear if these are related to panic episodes or other cause.  Labs pending.  Trial hydroxyzine , discussed lifestyle modifications and close PCP follow-up.  ED for significantly worsening symptoms.  Final Clinical Impressions(s) / UC  Diagnoses   Final diagnoses:  Palpitations  Anxiety  Elevated blood pressure reading     Discharge Instructions      Prescribed some medication to help with your anxiety symptoms as needed but be cautious that it can make you drowsy.  I have also obtained some lab work for further evaluation and we will let you know if anything comes back abnormal.  Your EKG was very reassuring today but as we discussed this only checks for a very brief moment in time so if you continue to have the palpitations follow-up with your primary care provider to determine if you would be a good candidate for an event monitor for a longer period of time.  Follow-up with your primary care provider as soon as possible for recheck of symptoms.    ED Prescriptions     Medication Sig Dispense Auth. Provider   hydrOXYzine  (ATARAX ) 25 MG tablet Take 1 tablet (25 mg total) by mouth every 8 (eight) hours as needed. 30 tablet Stuart Vernell Norris, [place]      PDMP not reviewed this encounter.   Stuart Vernell Norris, [place] 06/10/24 1920

## 2024-06-10 NOTE — ED Triage Notes (Signed)
 Pt reports feels anxious and reports heart racing for last several days. Denies pain or shortness of breath. NAD noted. VSS.

## 2024-06-10 NOTE — Discharge Instructions (Signed)
 Prescribed some medication to help with your anxiety symptoms as needed but be cautious that it can make you drowsy.  I have also obtained some lab work for further evaluation and we will let you know if anything comes back abnormal.  Your EKG was very reassuring today but as we discussed this only checks for a very brief moment in time so if you continue to have the palpitations follow-up with your primary care provider to determine if you would be a good candidate for an event monitor for a longer period of time.  Follow-up with your primary care provider as soon as possible for recheck of symptoms.

## 2024-06-11 ENCOUNTER — Ambulatory Visit (HOSPITAL_COMMUNITY): Payer: Self-pay

## 2024-06-11 LAB — COMPREHENSIVE METABOLIC PANEL WITH GFR
ALT: 11 IU/L (ref 0–32)
AST: 14 IU/L (ref 0–40)
Albumin: 4.1 g/dL (ref 3.9–4.9)
Alkaline Phosphatase: 67 IU/L (ref 44–121)
BUN/Creatinine Ratio: 9 (ref 9–23)
BUN: 7 mg/dL (ref 6–20)
Bilirubin Total: 0.3 mg/dL (ref 0.0–1.2)
CO2: 21 mmol/L (ref 20–29)
Calcium: 9.6 mg/dL (ref 8.7–10.2)
Chloride: 103 mmol/L (ref 96–106)
Creatinine, Ser: 0.8 mg/dL (ref 0.57–1.00)
Globulin, Total: 2.9 g/dL (ref 1.5–4.5)
Glucose: 96 mg/dL (ref 70–99)
Potassium: 3.5 mmol/L (ref 3.5–5.2)
Sodium: 140 mmol/L (ref 134–144)
Total Protein: 7 g/dL (ref 6.0–8.5)
eGFR: 97 mL/min/1.73 (ref >59)

## 2024-06-11 LAB — CBC WITH DIFFERENTIAL/PLATELET
Basophils Absolute: 0.1 x10E3/uL (ref 0.0–0.2)
Basos: 1 %
EOS (ABSOLUTE): 0.9 x10E3/uL — ABNORMAL HIGH (ref 0.0–0.4)
Eos: 13 %
Hematocrit: 35.2 % (ref 34.0–46.6)
Hemoglobin: 11.5 g/dL (ref 11.1–15.9)
Immature Grans (Abs): 0 x10E3/uL (ref 0.0–0.1)
Immature Granulocytes: 0 %
Lymphocytes Absolute: 2.3 x10E3/uL (ref 0.7–3.1)
Lymphs: 31 %
MCH: 29.6 pg (ref 26.6–33.0)
MCHC: 32.7 g/dL (ref 31.5–35.7)
MCV: 91 fL (ref 79–97)
Monocytes Absolute: 0.4 x10E3/uL (ref 0.1–0.9)
Monocytes: 5 %
Neutrophils Absolute: 3.8 x10E3/uL (ref 1.4–7.0)
Neutrophils: 50 %
Platelets: 330 x10E3/uL (ref 150–450)
RBC: 3.89 x10E6/uL (ref 3.77–5.28)
RDW: 14.1 % (ref 11.7–15.4)
WBC: 7.5 x10E3/uL (ref 3.4–10.8)

## 2024-06-11 LAB — TSH: TSH: 3.93 u[IU]/mL (ref 0.450–4.500)

## 2024-07-13 DIAGNOSIS — D509 Iron deficiency anemia, unspecified: Secondary | ICD-10-CM | POA: Diagnosis not present

## 2024-07-13 DIAGNOSIS — I10 Essential (primary) hypertension: Secondary | ICD-10-CM | POA: Diagnosis not present

## 2024-07-13 DIAGNOSIS — R202 Paresthesia of skin: Secondary | ICD-10-CM | POA: Diagnosis not present

## 2024-07-13 DIAGNOSIS — E559 Vitamin D deficiency, unspecified: Secondary | ICD-10-CM | POA: Diagnosis not present

## 2024-07-13 DIAGNOSIS — K219 Gastro-esophageal reflux disease without esophagitis: Secondary | ICD-10-CM | POA: Diagnosis not present

## 2024-07-13 DIAGNOSIS — Z6838 Body mass index (BMI) 38.0-38.9, adult: Secondary | ICD-10-CM | POA: Diagnosis not present

## 2024-07-13 DIAGNOSIS — F419 Anxiety disorder, unspecified: Secondary | ICD-10-CM | POA: Diagnosis not present

## 2024-07-13 DIAGNOSIS — M722 Plantar fascial fibromatosis: Secondary | ICD-10-CM | POA: Diagnosis not present

## 2024-07-27 DIAGNOSIS — M79641 Pain in right hand: Secondary | ICD-10-CM | POA: Diagnosis not present

## 2024-07-27 DIAGNOSIS — M542 Cervicalgia: Secondary | ICD-10-CM | POA: Diagnosis not present

## 2024-07-27 DIAGNOSIS — M79642 Pain in left hand: Secondary | ICD-10-CM | POA: Diagnosis not present

## 2024-08-09 ENCOUNTER — Other Ambulatory Visit (HOSPITAL_COMMUNITY): Payer: Self-pay | Admitting: Physician Assistant

## 2024-08-09 DIAGNOSIS — M542 Cervicalgia: Secondary | ICD-10-CM

## 2024-08-10 DIAGNOSIS — I10 Essential (primary) hypertension: Secondary | ICD-10-CM | POA: Diagnosis not present

## 2024-08-10 DIAGNOSIS — F411 Generalized anxiety disorder: Secondary | ICD-10-CM | POA: Diagnosis not present

## 2024-08-10 DIAGNOSIS — G5600 Carpal tunnel syndrome, unspecified upper limb: Secondary | ICD-10-CM | POA: Diagnosis not present

## 2024-08-13 ENCOUNTER — Ambulatory Visit (HOSPITAL_COMMUNITY)
Admission: RE | Admit: 2024-08-13 | Discharge: 2024-08-13 | Disposition: A | Discharge status: Home / Self Care | Source: Ambulatory Visit | Attending: Physician Assistant | Admitting: Physician Assistant

## 2024-08-13 DIAGNOSIS — M50223 Other cervical disc displacement at C6-C7 level: Secondary | ICD-10-CM

## 2024-08-13 DIAGNOSIS — M5021 Other cervical disc displacement,  high cervical region: Secondary | ICD-10-CM | POA: Diagnosis not present

## 2024-08-13 DIAGNOSIS — M50221 Other cervical disc displacement at C4-C5 level: Secondary | ICD-10-CM

## 2024-08-13 DIAGNOSIS — M542 Cervicalgia: Secondary | ICD-10-CM | POA: Diagnosis not present

## 2024-08-13 DIAGNOSIS — M5023 Other cervical disc displacement, cervicothoracic region: Secondary | ICD-10-CM | POA: Diagnosis not present

## 2024-08-20 DIAGNOSIS — G5603 Carpal tunnel syndrome, bilateral upper limbs: Secondary | ICD-10-CM | POA: Diagnosis not present

## 2024-08-24 ENCOUNTER — Other Ambulatory Visit (HOSPITAL_COMMUNITY): Payer: Self-pay

## 2024-08-24 ENCOUNTER — Other Ambulatory Visit: Payer: Self-pay

## 2024-08-24 DIAGNOSIS — M542 Cervicalgia: Secondary | ICD-10-CM | POA: Diagnosis not present

## 2024-08-24 MED ORDER — DULOXETINE HCL 20 MG PO CPEP
20.0000 mg | ORAL_CAPSULE | Freq: Every day | ORAL | 1 refills | Status: AC
  Filled 2024-08-24 – 2024-09-02 (×2): qty 90, 90d supply, fill #0

## 2024-09-02 ENCOUNTER — Other Ambulatory Visit: Payer: Self-pay

## 2024-09-02 ENCOUNTER — Other Ambulatory Visit (HOSPITAL_COMMUNITY): Payer: Self-pay

## 2024-09-03 ENCOUNTER — Other Ambulatory Visit (HOSPITAL_COMMUNITY): Payer: Self-pay

## 2024-09-10 ENCOUNTER — Other Ambulatory Visit (HOSPITAL_COMMUNITY): Payer: Self-pay

## 2024-09-10 ENCOUNTER — Telehealth: Admitting: Physician Assistant

## 2024-09-10 DIAGNOSIS — B309 Viral conjunctivitis, unspecified: Secondary | ICD-10-CM | POA: Diagnosis not present

## 2024-09-10 MED ORDER — OLOPATADINE HCL 0.2 % OP SOLN: 1.0000 [drp] | Freq: Every day | OPHTHALMIC | 0 refills | Status: AC

## 2024-09-10 MED ORDER — AMLODIPINE BESYLATE 2.5 MG PO TABS
2.5000 mg | ORAL_TABLET | Freq: Every day | ORAL | 1 refills | Status: AC
  Filled 2024-09-10: qty 90, 90d supply, fill #0
  Filled 2024-12-09: qty 90, 90d supply, fill #1

## 2024-09-10 NOTE — Progress Notes (Signed)
 E-Visit for [employer]   We are sorry that you are not feeling well.  Here is how we plan to help!  Based on what you have shared with me it looks like you have conjunctivitis.  Conjunctivitis is a common inflammatory or infectious condition of the eye that is often referred to as pink eye.  In most cases it is contagious (viral or bacterial). However, not all conjunctivitis requires antibiotics (ex. Allergic).  We have made appropriate suggestions for you based upon your presentation.  I recommend that you use Pataday which is an antihistamine eye drop for your symptoms. I have sent your prescription to the pharmacy.   Pink eye can be highly contagious.  It is typically spread through direct contact with secretions, or contaminated objects or surfaces that one may have touched.  Strict handwashing is suggested with soap and water is urged.  If not available, use alcohol based had sanitizer.  Avoid unnecessary touching of the eye.  If you wear contact lenses, you will need to refrain from wearing them until you see no white discharge from the eye for at least 24 hours after being on medication.  You should see symptom improvement in 1-2 days after starting the medication regimen.  Call us  if symptoms are not improved in 1-2 days.  Home Care: Wash your hands often! Do not wear your contacts until you complete your treatment plan. Avoid sharing towels, bed linen, personal items with a person who has pink eye. See attention for anyone in your home with similar symptoms.  Get Help Right Away If: Your symptoms do not improve. You develop blurred or loss of vision. Your symptoms worsen (increased discharge, pain or redness)   Thank you for choosing an e-visit.  Your e-visit answers were reviewed by a board certified advanced clinical practitioner to complete your personal care plan. Depending upon the condition, your plan could have included both over the counter or prescription  medications.  Please review your pharmacy choice. Make sure the pharmacy is open so you can pick up prescription now. If there is a problem, you may contact your provider through [employer] and have the prescription routed to another pharmacy.  Your safety is important to us . If you have drug allergies check your prescription carefully.   For the next 24 hours you can use MyChart to ask questions about today's visit, request a non-urgent call back, or ask for a work or school excuse. You will get an email in the next two days asking about your experience. I hope that your e-visit has been valuable and will speed your recovery.  I have spent 5 minutes in review of e-visit questionnaire, review and updating patient chart, medical decision making and response to patient.   Teena Shuck, PA-C

## 2024-09-13 ENCOUNTER — Other Ambulatory Visit (HOSPITAL_COMMUNITY): Payer: Self-pay

## 2024-09-19 ENCOUNTER — Other Ambulatory Visit (HOSPITAL_COMMUNITY): Payer: Self-pay

## 2024-09-19 MED ORDER — GABAPENTIN 100 MG PO CAPS
100.0000 mg | ORAL_CAPSULE | Freq: Every evening | ORAL | 0 refills | Status: AC
  Filled 2024-09-19 – 2024-11-05 (×2): qty 30, 30d supply, fill #0

## 2024-09-27 DIAGNOSIS — H04123 Dry eye syndrome of bilateral lacrimal glands: Secondary | ICD-10-CM | POA: Diagnosis not present

## 2024-09-27 DIAGNOSIS — H0288A Meibomian gland dysfunction right eye, upper and lower eyelids: Secondary | ICD-10-CM | POA: Diagnosis not present

## 2024-09-27 DIAGNOSIS — H0288B Meibomian gland dysfunction left eye, upper and lower eyelids: Secondary | ICD-10-CM | POA: Diagnosis not present

## 2024-10-04 ENCOUNTER — Other Ambulatory Visit (HOSPITAL_COMMUNITY): Payer: Self-pay

## 2024-10-18 ENCOUNTER — Other Ambulatory Visit (HOSPITAL_COMMUNITY): Payer: Self-pay

## 2024-10-20 ENCOUNTER — Other Ambulatory Visit (HOSPITAL_COMMUNITY): Payer: Self-pay

## 2024-10-20 MED ORDER — VALACYCLOVIR HCL 500 MG PO TABS
500.0000 mg | ORAL_TABLET | Freq: Two times a day (BID) | ORAL | 0 refills | Status: AC | PRN
  Filled 2024-10-20 – 2024-11-05 (×2): qty 30, 15d supply, fill #0

## 2024-10-29 DIAGNOSIS — G5603 Carpal tunnel syndrome, bilateral upper limbs: Secondary | ICD-10-CM | POA: Diagnosis not present

## 2024-10-29 DIAGNOSIS — G5602 Carpal tunnel syndrome, left upper limb: Secondary | ICD-10-CM | POA: Diagnosis not present

## 2024-10-30 ENCOUNTER — Other Ambulatory Visit (HOSPITAL_COMMUNITY): Payer: Self-pay

## 2024-11-05 ENCOUNTER — Other Ambulatory Visit (HOSPITAL_COMMUNITY): Payer: Self-pay

## 2024-11-05 ENCOUNTER — Other Ambulatory Visit (HOSPITAL_BASED_OUTPATIENT_CLINIC_OR_DEPARTMENT_OTHER): Payer: Self-pay

## 2024-11-10 ENCOUNTER — Other Ambulatory Visit (HOSPITAL_COMMUNITY): Payer: Self-pay
